# Patient Record
Sex: Male | Born: 1965 | Race: White | Hispanic: No | Smoking: Never smoker
Health system: Southern US, Community
[De-identification: ages and names within clinical notes are randomized; demographics above are authoritative.]

## PROBLEM LIST (undated history)

## (undated) DIAGNOSIS — I480 Paroxysmal atrial fibrillation: Secondary | ICD-10-CM

## (undated) DIAGNOSIS — N200 Calculus of kidney: Secondary | ICD-10-CM

## (undated) DIAGNOSIS — I10 Essential (primary) hypertension: Secondary | ICD-10-CM

## (undated) DIAGNOSIS — I7300 Raynaud's syndrome without gangrene: Secondary | ICD-10-CM

## (undated) HISTORY — DX: Raynaud's syndrome without gangrene: I73.00

## (undated) HISTORY — DX: Paroxysmal atrial fibrillation: I48.0

## (undated) HISTORY — DX: Essential (primary) hypertension: I10

---

## 2006-07-03 HISTORY — PX: OTHER SURGICAL HISTORY: SHX169

## 2011-09-17 ENCOUNTER — Encounter (HOSPITAL_COMMUNITY): Payer: Self-pay | Admitting: *Deleted

## 2011-09-17 ENCOUNTER — Emergency Department (HOSPITAL_COMMUNITY)
Admission: EM | Admit: 2011-09-17 | Discharge: 2011-09-17 | Disposition: A | Discharge status: Home / Self Care | Payer: BC Managed Care – PPO | Attending: Emergency Medicine | Admitting: Emergency Medicine

## 2011-09-17 DIAGNOSIS — I4891 Unspecified atrial fibrillation: Secondary | ICD-10-CM | POA: Insufficient documentation

## 2011-09-17 DIAGNOSIS — R109 Unspecified abdominal pain: Secondary | ICD-10-CM | POA: Insufficient documentation

## 2011-09-17 DIAGNOSIS — I2510 Atherosclerotic heart disease of native coronary artery without angina pectoris: Secondary | ICD-10-CM | POA: Insufficient documentation

## 2011-09-17 DIAGNOSIS — N23 Unspecified renal colic: Secondary | ICD-10-CM

## 2011-09-17 DIAGNOSIS — Z87442 Personal history of urinary calculi: Secondary | ICD-10-CM | POA: Insufficient documentation

## 2011-09-17 DIAGNOSIS — R110 Nausea: Secondary | ICD-10-CM | POA: Insufficient documentation

## 2011-09-17 HISTORY — DX: Calculus of kidney: N20.0

## 2011-09-17 LAB — URINALYSIS, ROUTINE W REFLEX MICROSCOPIC
Glucose, UA: NEGATIVE mg/dL
Leukocytes, UA: NEGATIVE
Nitrite: NEGATIVE
Protein, ur: NEGATIVE mg/dL

## 2011-09-17 LAB — URINE MICROSCOPIC-ADD ON

## 2011-09-17 MED ORDER — ONDANSETRON HCL 4 MG PO TABS: 4.0000 mg | ORAL_TABLET | Freq: Four times a day (QID) | ORAL | Status: AC

## 2011-09-17 MED ORDER — OXYCODONE-ACETAMINOPHEN 5-325 MG PO TABS
1.0000 | ORAL_TABLET | Freq: Once | ORAL | Status: AC
  Administered 2011-09-17: 1 via ORAL
  Filled 2011-09-17 (×2): qty 1

## 2011-09-17 MED ORDER — OXYCODONE-ACETAMINOPHEN 5-325 MG PO TABS: 1.0000 | ORAL_TABLET | ORAL | Status: AC | PRN

## 2011-09-17 MED ORDER — ONDANSETRON 4 MG PO TBDP
8.0000 mg | ORAL_TABLET | Freq: Once | ORAL | Status: AC
  Administered 2011-09-17: 8 mg via ORAL
  Filled 2011-09-17 (×2): qty 2

## 2011-09-17 NOTE — Discharge Instructions (Signed)
FOLLOW UP WITH [facility] FOR FURTHER EVALUATION AND MANAGEMENT IF YOU HAVE PERSISTENT PAIN. RETURN HERE WITH ANY WORSENING PAIN, HIGH FEVER OR UNCONTROLLED VOMITING. TAKE MEDICATIONS AS PRESCRIBED.  Ureteral Colic Ureteral colic is spasm-like pain from the kidney or the ureter. This is often caused by a kidney stone. The pain is caused by the stone trying to get through the tubes that pass your pee. HOME CARE   Drink enough fluids to keep your pee (urine) clear or pale yellow.   Strain all your pee. A strainer will be provided. Keep anything caught in the strainer and bring it to your doctor. The stone causing the pain may be very small.   Only take medicine as told by your doctor.   Follow up with your doctor as told.  GET HELP RIGHT AWAY IF:   Pain is not controlled with medicine.   Pain continues or gets worse.   The pain changes and there is chest or belly (abdominal) pain.   You pass out (faint).   You cannot pee.   You keep throwing up (vomiting).   You have a temperature by mouth above 102 F (38.9 C), not controlled by medicine.  MAKE SURE YOU:   Understand these instructions.   Will watch this condition.   Will get help right away if you are not doing well or get worse.  Document Released: 12/06/2007 Document Revised: 06/08/2011 Document Reviewed: 12/06/2007 [facility] Patient Information 2012 [place], [place].

## 2011-09-17 NOTE — ED Notes (Signed)
Discharge instructions reviewed with pt; verbalizes understanding.  No questions asked; no further c/o's voiced.  Pt ambulatory to lobby.  NAD noted. 

## 2011-09-17 NOTE — ED Provider Notes (Signed)
History     CSN: [id number]  Arrival date & time 09/17/11  2034   First MD Initiated Contact with Patient 09/17/11 2132      Chief Complaint  Patient presents with  . poss kidnes stone     (Consider location/radiation/quality/duration/timing/severity/associated sxs/prior treatment) Patient is a 46 y.o. male presenting with flank pain. The history is provided by the patient.  Flank Pain This is a new problem. The current episode started today. The problem occurs constantly. The problem has been gradually worsening. Associated symptoms include abdominal pain and nausea. Pertinent negatives include no chills, fever or vomiting. Associated symptoms comments: Left flank pain radiating into LLQ starting tonight. He has a remote history of kidney stones with similar symptoms. No difficulty urinating, no fever, no gross hematuria. . The symptoms are aggravated by nothing.  Flank Pain This is a new problem. The current episode started today. The problem occurs constantly. The problem has been gradually worsening. Associated symptoms include abdominal pain. Associated symptoms comments: Left flank pain radiating into LLQ starting tonight. He has a remote history of kidney stones with similar symptoms. No difficulty urinating, no fever, no gross hematuria. . The symptoms are aggravated by nothing.    Past Medical History  Diagnosis Date  . Kidney calculi   . Coronary artery disease   . Atrial fibrillation     History reviewed. No pertinent past surgical history.  History reviewed. No pertinent family history.  History  Substance Use Topics  . Smoking status: Never Smoker   . Smokeless tobacco: Not on file  . Alcohol Use: Yes      Review of Systems  Constitutional: Negative for fever and chills.  HENT: Negative.   Respiratory: Negative.   Cardiovascular: Negative.   Gastrointestinal: Positive for nausea and abdominal pain. Negative for vomiting.  Genitourinary: Positive for flank  pain. Negative for hematuria, scrotal swelling and testicular pain.  Musculoskeletal: Negative.   Skin: Negative.   Neurological: Negative.     Allergies  Review of patient's allergies indicates no known allergies.  Home Medications   Current Outpatient Rx  Name Route Sig Dispense Refill  . CITALOPRAM HYDROBROMIDE 20 MG PO TABS Oral Take 20 mg by mouth daily.      BP 146/94  Pulse 75  Temp(Src) 97.6 F (36.4 C) (Oral)  Resp 18  SpO2 98%  Physical Exam  Constitutional: He is oriented to person, place, and time. He appears well-developed and well-nourished.  Neck: Normal range of motion.  Pulmonary/Chest: Effort normal.  Abdominal: There is no tenderness. There is no rebound and no guarding.  Genitourinary:       No reproducible CVA tenderness.  Musculoskeletal: Normal range of motion.  Neurological: He is alert and oriented to person, place, and time.  Skin: Skin is warm and dry.  Psychiatric: He has a normal mood and affect.    ED Course  Procedures (including critical care time)  Labs Reviewed  URINALYSIS, ROUTINE W REFLEX MICROSCOPIC - Abnormal; Notable for the following:    APPearance CLOUDY (*)    Hgb urine dipstick LARGE (*)    All other components within normal limits  URINE MICROSCOPIC-ADD ON  URINALYSIS, ROUTINE W REFLEX MICROSCOPIC   No results found.   No diagnosis found.    MDM  Patient's VSS, no fever, hematuria and flank pain with nausea. He has history of stones that have passed without intervention. Feel referral to urology without CT evaluation is appropriate. He has be counseled on symptoms that  should prompt return.   Medical screening examination/treatment/procedure(s) were performed by non-physician practitioner and as supervising physician I was immediately available for consultation/collaboration. Osvaldo Human, M.D.      Rodena Medin, PA-C 09/17/11 2233  Carleene Cooper III, MD 09/19/11 [phone]

## 2011-09-17 NOTE — ED Notes (Signed)
The pt has had lt abd pain for one hour nauseated.  Hx of kidney stones

## 2011-09-17 NOTE — ED Notes (Signed)
Pt reports left sided non-radiating flank pain that started around 2000 this evening. Pt stated he has a history of kidney stones and it feels like the same pain.  Pt admits to nausea with one episode of emesis.

## 2011-12-14 DIAGNOSIS — R490 Dysphonia: Secondary | ICD-10-CM | POA: Insufficient documentation

## 2012-09-10 ENCOUNTER — Ambulatory Visit (INDEPENDENT_AMBULATORY_CARE_PROVIDER_SITE_OTHER): Payer: BC Managed Care – PPO | Admitting: Internal Medicine

## 2012-09-10 ENCOUNTER — Encounter: Payer: Self-pay | Admitting: Internal Medicine

## 2012-09-10 VITALS — BP 115/75 | HR 76 | Ht 72.0 in | Wt 195.6 lb

## 2012-09-10 DIAGNOSIS — I10 Essential (primary) hypertension: Secondary | ICD-10-CM

## 2012-09-10 DIAGNOSIS — I4891 Unspecified atrial fibrillation: Secondary | ICD-10-CM

## 2012-09-10 MED ORDER — DILTIAZEM HCL 120 MG PO TABS: 120.0000 mg | ORAL_TABLET | Freq: Every day | ORAL | Status: DC

## 2012-09-10 MED ORDER — DILTIAZEM HCL ER COATED BEADS 120 MG PO CP24: 120.0000 mg | ORAL_CAPSULE | Freq: Every day | ORAL | Status: DC

## 2012-09-10 NOTE — Assessment & Plan Note (Signed)
As above.

## 2012-09-10 NOTE — Assessment & Plan Note (Signed)
The patient has post ablation recurrences of atrial fibrillation that are infrequent. His CHADS-VASc score is sufficiently low that I don't think anticoagulation is necessary especially because of his aversions given his bicycling. We will also have to stop his aspirin.  There appears to be a vagal component to his atrial fibrillation notable for his postprandial onset. We will discontinue his beta blocker and put him on a calcium blocker. This may also improve   exercise performance.  He does not snore.  I also let him know that Dr. Clydie Braun and returned to the area and is working in [employer]

## 2012-09-10 NOTE — Patient Instructions (Addendum)
1. Your physician has requested that you have an echocardiogram. Echocardiography is a painless test that uses sound waves to create images of your heart. It provides your doctor with information about the size and shape of your heart and    how well your heart's chambers and valves are working. This procedure takes approximately one hour. There are no restrictions for this procedure.  2. Your physician has recommended you make the following change in your medication:   Stop Metoprolol Start Cardizem 120 mg by mouth once a day.  3. Your physician wants you to follow-up in: 1 year with Dr. Graciela Husbands. You will receive a reminder letter in the mail two months in advance. If you don't receive a letter, please call our office to schedule the follow-up appointment.

## 2012-09-10 NOTE — Addendum Note (Signed)
Addended by: Sherri Rad C on: 09/10/2012 06:01 PM   Modules accepted: Orders

## 2012-09-10 NOTE — Progress Notes (Signed)
   ELECTROPHYSIOLOGY CONSULT NOTE  Patient ID: Reginald Rich, MRN: [id number], DOB/AGE: 03/02/1966 47 y.o. Admit date: (Not on file) Date of Consult: 09/10/2012  Primary Physician: Default, Provider, MD Primary Cardiologist: new  Chief Complaint:  afib   HPI Reginald Rich is a 47 y.o. male seen to establish care for atrial fibrillation.  He has a long-standing history and had failed multiple medications and underwent catheter ablation by Dr. Clydie Braun in [facility] 2008. This is associated with a very beneficial affect characterized by significant reduction in AF episodes. These episodes typically are postprandial and resolved by the early morning. They occur every couple of months.  He has no known neurological symptoms. He does have treated hypertension. His CHADS-VASc score is 1 and his CHADS- score is 1.  He denies snoring. He is very fit.   Past Medical History  Diagnosis Date  . Kidney calculi   . Hypertension   . Atrial fibrillation       Surgical History: No past surgical history on file.   Home Meds: Prior to Admission medications   Medication Sig Start Date End Date Taking? Authorizing Provider  metoprolol (LOPRESSOR) 50 MG tablet Take 50 mg by mouth 2 (two) times daily.  08/26/12  Yes Historical Provider, MD    Allergies: No Known Allergies  History   Social History  . Marital Status: Married    Spouse Name: N/A    Number of Children: N/A  . Years of Education: N/A   Occupational History  . Not on file.   Social History Main Topics  . Smoking status: Never Smoker   . Smokeless tobacco: Not on file  . Alcohol Use: Yes  . Drug Use:   . Sexually Active:    Other Topics Concern  . Not on file   Social History Narrative  . No narrative on file     No family history on file.   ROS:  Please see the history of present illness.     All other systems reviewed and negative.    Physical Exam: Blood pressure 115/75, pulse 76, height 6' (1.829  m), weight 195 lb 9.6 oz (88.724 kg). General: Well developed, well nourished male in no acute distress. Head: Normocephalic, atraumatic, sclera non-icteric, no xanthomas, nares are without discharge. Lymph Nodes:  none Back: without scoliosis/kyphosis, no CVA tendersness Neck: Negative for carotid bruits. JVD not elevated. Lungs: Clear bilaterally to auscultation without wheezes, rales, or rhonchi. Breathing is unlabored. Heart: RRR with S1 S2. No murmur , rubs, or gallops appreciated. Abdomen: Soft, non-tender, non-distended with normoactive bowel sounds. No hepatomegaly. No rebound/guarding. No obvious abdominal masses. Msk:  Strength and tone appear normal for age. Extremities: No clubbing or cyanosis. No  edema.  Distal pedal pulses are 2+ and equal bilaterally. Skin: Warm and Dry Neuro: Alert and oriented X 3. CN III-XII intact Grossly normal sensory and motor function . Psych:  Responds to questions appropriately with a normal affect.      Labs:   Radiology/Studies:  No results found.  EKG:  Sinus rhythm at 75 Interval 17/09/38 Axis is -13   Assessment and Plan:    Sherryl Manges

## 2012-09-17 ENCOUNTER — Ambulatory Visit (HOSPITAL_COMMUNITY): Payer: BC Managed Care – PPO | Attending: Cardiology | Admitting: Radiology

## 2012-09-17 DIAGNOSIS — I10 Essential (primary) hypertension: Secondary | ICD-10-CM | POA: Insufficient documentation

## 2012-09-17 DIAGNOSIS — I379 Nonrheumatic pulmonary valve disorder, unspecified: Secondary | ICD-10-CM | POA: Insufficient documentation

## 2012-09-17 DIAGNOSIS — I4891 Unspecified atrial fibrillation: Secondary | ICD-10-CM

## 2012-09-17 NOTE — Progress Notes (Signed)
Echocardiogram performed.  

## 2012-10-02 ENCOUNTER — Telehealth: Payer: Self-pay | Admitting: Internal Medicine

## 2012-10-02 NOTE — Telephone Encounter (Signed)
New problem   Pt stated Reginald Rich called him about his lab results. Please call pt.

## 2012-10-02 NOTE — Telephone Encounter (Signed)
Pt notified or normal Echo results.

## 2012-12-13 ENCOUNTER — Telehealth: Payer: Self-pay | Admitting: Internal Medicine

## 2012-12-13 NOTE — Telephone Encounter (Signed)
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 He also states it is ok to schedule him a "late day" appt, it that OK? He asked for 3:45pm? Thanks!!

## 2012-12-13 NOTE — Telephone Encounter (Signed)
Yes, ok to use 2 acute slots for new pt.

## 2012-12-13 NOTE — Telephone Encounter (Signed)
Done kh

## 2012-12-26 ENCOUNTER — Ambulatory Visit (INDEPENDENT_AMBULATORY_CARE_PROVIDER_SITE_OTHER): Payer: BC Managed Care – PPO | Admitting: Internal Medicine

## 2012-12-26 ENCOUNTER — Encounter: Payer: Self-pay | Admitting: Internal Medicine

## 2012-12-26 VITALS — BP 140/84 | HR 80 | Temp 98.0°F | Ht 73.0 in | Wt 196.0 lb

## 2012-12-26 DIAGNOSIS — R6882 Decreased libido: Secondary | ICD-10-CM

## 2012-12-26 DIAGNOSIS — G4700 Insomnia, unspecified: Secondary | ICD-10-CM

## 2012-12-26 DIAGNOSIS — I4891 Unspecified atrial fibrillation: Secondary | ICD-10-CM

## 2012-12-26 DIAGNOSIS — R635 Abnormal weight gain: Secondary | ICD-10-CM

## 2012-12-26 MED ORDER — CLONAZEPAM 0.5 MG PO TABS: ORAL_TABLET | ORAL | Status: DC

## 2012-12-26 NOTE — Assessment & Plan Note (Signed)
Use low dose clonazepam as needed.

## 2012-12-26 NOTE — Progress Notes (Signed)
Subjective:    Patient ID: Reginald Rich, male    DOB: [birth year], 47 y.o.   MRN: [id number]  HPI  47 year old white male with history of known atrial fibrillation (status post ablation 2008) mild hypertension to establish. Patient complains of unexplained weight gain since September of 2013. At the same time patient also experienced low libido. He did stop exercising regularly. He did not feel any motivation. No significant change in dietary habits.  He was seen by his previous primary care physician who ordered free testosterone levels. Initially there were low normal but then returned to normal range. There was consideration of possibly using testosterone replacement.  Patient previously was taking beta blocker for intermittent A. Fib.  Patient reports he has not taken metoprolol regularly in over year. He takes metoprolol intermittently.  Unfortunately patient's intermittent A. Fib has returned. He is experiencing episodes every 2 weeks. A. Fib episodes can last between 10 minutes to  8-12 hours.  He was prescribed Cardizem by his cardiologist but it seemed to make palpitations worse.  Patient was on several antiarrhythmics exception of amiodarone before his ablation. He is not interested in trying other antiarrhythmics. He would like to consider repeating ablation for lone atrial fibrillation.  He also complains of intermittent sleep issues.  This has been ongoing for years. He has a habit of drinking liquids all through late afternoon and evening. He gets up once a night to urinate and goes back to sleep. Over last year he has been having difficulty returning to sleep. His previous primary care doctor tried him on zolpidem 10 mg. He could not tolerate  due to hangover effect.  He denies any signs or symptoms of objective sleep apnea. His wife has never noticed her snoring. He also denies any abnormal limb movements during sleep. Abnormal limb movements have been a problem whenever he takes  Ambien.  We reviewed his caffeine use.  He drinks green tea in the morning.  He drinks mainly on the weekend.   Review of Systems  Constitutional: Negative for appetite change.  Approximately 25 pound weight gain over last 12 months Eyes: Negative for visual disturbance.  Respiratory: Negative for cough, chest tightness and shortness of breath.   Cardiovascular: Negative for chest pain.  Genitourinary: Negative for difficulty urinating. Negative for urinary urgency or frequency Neurological: Negative for headaches.  Gastrointestinal: Negative for abdominal pain, heartburn melena or hematochezia Psych: Negative for depression or anxiety Endo:  No polyuria or polydypsia      Past Medical History  Diagnosis Date  . Kidney calculi   . Hypertension   . Atrial fibrillation     History   Social History  . Marital Status: Married    Spouse Name: N/A    Number of Children: N/A  . Years of Education: N/A   Occupational History  . Engineer    Social History Main Topics  . Smoking status: Never Smoker   . Smokeless tobacco: Not on file  . Alcohol Use: Yes  . Drug Use: No  . Sexually Active: Not on file   Other Topics Concern  . Not on file   Social History Narrative   Works as Art gallery manager for [employer]   Married - 2 sons    Past Surgical History  Procedure Laterality Date  . Ablation  2008    Family History  Problem Relation Age of Onset  . Atrial fibrillation Mother     No Known Allergies  No current outpatient prescriptions on file  prior to visit.   No current facility-administered medications on file prior to visit.    BP 140/84  Pulse 80  Temp(Src) 98 F (36.7 C) (Oral)  Ht 6\' 1"  (1.854 m)  Wt 196 lb (88.905 kg)  BMI 25.86 kg/m2    Lab results reviewed. See attached sheets from [facility]    Objective:   Physical Exam  Constitutional: He is oriented to person, place, and time. He appears well-developed and well-nourished. No  distress.  HENT:  Head: Normocephalic and atraumatic.  Right Ear: External ear normal.  Left Ear: External ear normal.  Mouth/Throat: Oropharynx is clear and moist. No oropharyngeal exudate.  Eyes: EOM are normal. Pupils are equal, round, and reactive to light. No scleral icterus.  Neck: Normal range of motion. Neck supple. No thyromegaly present.  No carotid bruit  Cardiovascular: Normal rate, regular rhythm, normal heart sounds and intact distal pulses.   No murmur heard. Pulmonary/Chest: Effort normal and breath sounds normal. He has no wheezes.  Abdominal: Soft. Bowel sounds are normal. He exhibits no mass. There is no tenderness.  Musculoskeletal: He exhibits no edema.  Lymphadenopathy:    He has no cervical adenopathy.  Neurological: He is alert and oriented to person, place, and time. No cranial nerve deficit.  Skin: Skin is warm and dry.  Psychiatric: He has a normal mood and affect. His behavior is normal.          Assessment & Plan:

## 2012-12-26 NOTE — Assessment & Plan Note (Signed)
Patient experienced decrease in libido over last 12 months. This is been associated with weight gain. Free testosterone levels are normal. I doubt his symptoms secondary to hypogonadism. I suspect his sleep issues may be responsible for libido changes and weight gain.  We discussed sleep hygiene changes.  Due to unexplained weight gain, I suggest we repeat thyroid function tests.  Patient advised to continue his regular exercise program and also monitor his caloric intake.

## 2012-12-26 NOTE — Assessment & Plan Note (Addendum)
Patient experiencing recurrence of intermittent atrial fibrillation. He is not interested in retrying antiarrhythmics. He would like referral to [facility] electrophysiologist who performs cardiac ablations.  Defer to cardiology re: changing B Blocker.  I suspect metoprolol contributing to low libido.

## 2012-12-26 NOTE — Patient Instructions (Addendum)
Cut out all caffeine in your diet Stop liquid intake after 6 pm or with your evening meal

## 2012-12-27 LAB — TSH: TSH: 1.24 u[IU]/mL (ref 0.35–5.50)

## 2013-01-16 ENCOUNTER — Ambulatory Visit: Payer: BC Managed Care – PPO | Admitting: Internal Medicine

## 2013-04-04 ENCOUNTER — Encounter: Payer: Self-pay | Admitting: *Deleted

## 2013-04-07 ENCOUNTER — Encounter: Payer: Self-pay | Admitting: Internal Medicine

## 2013-04-07 ENCOUNTER — Telehealth: Payer: Self-pay | Admitting: Internal Medicine

## 2013-04-07 ENCOUNTER — Ambulatory Visit (INDEPENDENT_AMBULATORY_CARE_PROVIDER_SITE_OTHER): Payer: BC Managed Care – PPO | Admitting: Internal Medicine

## 2013-04-07 VITALS — BP 147/90 | HR 86 | Ht 73.0 in | Wt 191.0 lb

## 2013-04-07 DIAGNOSIS — I4891 Unspecified atrial fibrillation: Secondary | ICD-10-CM

## 2013-04-07 DIAGNOSIS — I7300 Raynaud's syndrome without gangrene: Secondary | ICD-10-CM | POA: Insufficient documentation

## 2013-04-07 MED ORDER — DILTIAZEM HCL ER COATED BEADS 120 MG PO CP24: 120.0000 mg | ORAL_CAPSULE | Freq: Every day | ORAL | Status: DC

## 2013-04-07 MED ORDER — FLECAINIDE ACETATE 100 MG PO TABS: 100.0000 mg | ORAL_TABLET | Freq: Two times a day (BID) | ORAL | Status: DC

## 2013-04-07 NOTE — Progress Notes (Signed)
Patient Care Team: Doe-Hyun Sherran Needs, DO as PCP - General (Internal Medicine)   HPI  Toron Canning is a 47 y.o. male Seen in followup for atrial fibrillation. He is status post catheter ablation by Dr. Sampson Goon.  The last visit we tried him on Rythmol which he found to be proarrhythmic with more palpitations. He noted further that was associated with feeling cold and blue fingers and toes He is also noted that his blood pressures been up.  His CHADS-VASc score is 1 and he is not on anticoagulation  Past Medical History  Diagnosis Date  . Kidney calculi   . Hypertension   . Atrial fibrillation     Past Surgical History  Procedure Laterality Date  . Ablation  2008    Current Outpatient Prescriptions  Medication Sig Dispense Refill  . clonazePAM (KLONOPIN) 0.5 MG tablet Take 1/2 to 1 tablet at bedtime as needed  30 tablet  1   No current facility-administered medications for this visit.    No Known Allergies  Review of Systems negative except from HPI and PMH  Physical Exam BP 147/90  Pulse 86  Ht 6\' 1"  (1.854 m)  Wt 191 lb (86.637 kg)  BMI 25.2 kg/m2 W Also bradycardic ell developed and nourished in no acute distress HENT normal Neck supple with JVP-flat Clear Regular rate and rhythm, no murmurs or gallops Abd-soft with active BS No Clubbing cyanosis edema Skin-warm and dry A & Oriented  Grossly normal sensory and motor function  ecg NSR  17/09/36  Assessment and  Plan

## 2013-04-07 NOTE — Telephone Encounter (Signed)
New Problem  Pt states he was prescribed Cardizem and he is already taking the medication and is having trouble with it// please call back to discus.Marland Kitchen

## 2013-04-07 NOTE — Patient Instructions (Addendum)
Your physician has recommended you make the following change in your medication:  1) Start Flecanide 100 twice daily 2) Start Cardizem 120 mg once daily  Your physician has requested that you have an exercise tolerance test. For further information please visit [email or web address] Please also follow instruction sheet, as given.  Your physician recommends that you schedule a follow-up appointment in: 3 months with Dr. Graciela Husbands.

## 2013-04-08 ENCOUNTER — Other Ambulatory Visit: Payer: Self-pay | Admitting: *Deleted

## 2013-04-08 DIAGNOSIS — I4891 Unspecified atrial fibrillation: Secondary | ICD-10-CM

## 2013-04-08 NOTE — Telephone Encounter (Signed)
Patient states that he was taking Cardizem before (not Rythmol), and this medication aggravated his AFib. He wants to make sure Dr. Graciela Husbands still wants him to take this medication. I will discuss with Dr. Graciela Husbands and get back with him on advisement. Patient agreeable to plan.

## 2013-04-08 NOTE — Telephone Encounter (Signed)
Advised patient to continue Cardizem, and we want to see how the Cardizem and Flecanide together affects him. If symptoms persist our next step is to try Rythmol and explained this to patient. He will keep [place] informed if issues arise.  He inquired about GXT scheduling and I told him I would get back with him. Patient agreeable to plan.

## 2013-04-08 NOTE — Telephone Encounter (Signed)
Explained to patient that our clinical supervisor, Doylene Bode, was looking into the scheduling and we will be in touch with him by the end of the week to schedule this procedure for him. Patient is agreeable to plan

## 2013-04-09 NOTE — Telephone Encounter (Signed)
New Problem  Pt states that he was recommended for an ablation and request a call back to discuss further.

## 2013-04-09 NOTE — Telephone Encounter (Signed)
Spoke with patient who states he has discussed medication treatment options with Dr. Graciela Husbands and patient would rather have an ablation.  I advised patient that the nurses that handle the scheduling for ablations are not in the office today and that I will forward message for someone to call him back tomorrow.  Patient states he had an ablation 5 years ago and thinks he needs a "touch up."  I advised that I would route message to EP team and that someone would call him back tomorrow.  Patient verbalized understanding and agreement.

## 2013-04-10 NOTE — Telephone Encounter (Signed)
Returned patient's call - he voiced his request to have an ablation instead of trying medication again. He states that he has been down that road before and ended up with ablation anyway, he would like to go forward with procedure and not with medication. I explained that I would discuss with Dr. Graciela Husbands and get back with him later today.

## 2013-04-10 NOTE — Assessment & Plan Note (Signed)
Pr with recurrent AFib and not quite sure what his meds are but thinks propafenone made it worse   we discussed drug alternatives, including flecainide or repeat catheter ablation   His previous porcedure was done by Clydie Braun  He initially decided to pursue drug therapy, but has called back, and asked to see Dr Fawn Kirk for consideration of ablation  We will arrange  He will need to take anticoagulation in anticipation of that procedure so will begin him but defer initiation until he sees Dr Fawn Kirk as he is bike rider and would like to minimize the time of increased risk

## 2013-04-10 NOTE — Telephone Encounter (Signed)
Scheduled patient to see Dr. Johney Frame for consult for ablation - 05/01/13 at 3:45pm. Patient verbalizes understanding and agreeable to plan. His GXT has been cancelled.

## 2013-04-21 ENCOUNTER — Other Ambulatory Visit: Payer: Self-pay | Admitting: Internal Medicine

## 2013-05-01 ENCOUNTER — Encounter: Payer: Self-pay | Admitting: Internal Medicine

## 2013-05-01 ENCOUNTER — Ambulatory Visit (INDEPENDENT_AMBULATORY_CARE_PROVIDER_SITE_OTHER): Payer: BC Managed Care – PPO | Admitting: Internal Medicine

## 2013-05-01 VITALS — BP 148/88 | HR 69 | Ht 73.0 in | Wt 191.1 lb

## 2013-05-01 DIAGNOSIS — I10 Essential (primary) hypertension: Secondary | ICD-10-CM

## 2013-05-01 DIAGNOSIS — I4891 Unspecified atrial fibrillation: Secondary | ICD-10-CM

## 2013-05-01 MED ORDER — LOSARTAN POTASSIUM 25 MG PO TABS: 25.0000 mg | ORAL_TABLET | Freq: Every day | ORAL | Status: DC

## 2013-05-01 NOTE — Progress Notes (Signed)
Primary Care Physician: Thomos Lemons, DO Referring Physician:  Dr Charlaine Dalton is a 47 y.o. male with a h/o paroxysmal atrial fibrillation and hypertension s/p prior afib ablation by Dr Sampson Goon in 2008 who presents for further evaluation of afib.  He reports initially being diagnosed with atrial fibrillation in 2002.  He failed medical therapy diltiazem, flecainide, rhythmol, sotalol, and digoxin.  He then underwent afib ablation in 2008.  He reports doing very well without recurrent afib for 3-4 years.  Over the past year, he has had recurrence of his afib.  Episodes have increased in both frequency and duration.  He has tried Rythmol and flecainide without improvement.  He felt that he actually had more afib when he tried diltiazem.  He reports episodes of afib nearly daily lasting through the evening and terminating in the morning.  He is unaware of any triggers or precipitants.  Interestingly, he has started lexapro 3 weeks ago.  He has had no afib over the past 2 weeks. During afib, he reports symptoms of palpitations with associated decreased exercise tolerance, and postural dizziness.  Today, he denies symptoms of chest pain, shortness of breath at rest, orthopnea, PND, lower extremity edema, presyncope, syncope, snoring/ apnea, or neurologic sequela. The patient is otherwise without complaint today.   Past Medical History  Diagnosis Date  . Kidney calculi   . Hypertension   . Paroxysmal atrial fibrillation   . Raynaud's phenomenon associated with beta blocker use    Past Surgical History  Procedure Laterality Date  . Afib ablation  2008    [facility] by Dr Sampson Goon    Current Outpatient Prescriptions  Medication Sig Dispense Refill  . clonazePAM (KLONOPIN) 0.5 MG tablet TAKE 1/2 TO 1 TABLET BY MOUTH EVERY NIGHT AT BEDTIME AS NEEDED  30 tablet  3  . escitalopram (LEXAPRO) 10 MG tablet Take 1 tablet by mouth daily.       No current facility-administered medications  for this visit.    No Known Allergies  History   Social History  . Marital Status: Married    Spouse Name: N/A    Number of Children: N/A  . Years of Education: N/A   Occupational History  . Engineer    Social History Main Topics  . Smoking status: Never Smoker   . Smokeless tobacco: Not on file  . Alcohol Use: Yes  . Drug Use: No  . Sexual Activity: Not on file   Other Topics Concern  . Not on file   Social History Narrative   Works as Art gallery manager for [employer]   Married - 2 sons    Family History  Problem Relation Age of Onset  . Atrial fibrillation Mother 6    ROS- All systems are reviewed and negative except as per the HPI above  Physical Exam: Filed Vitals:   05/01/13 1634  BP: 148/88  Pulse: 69  Height: 6\' 1"  (1.854 m)  Weight: 191 lb 1.9 oz (86.691 kg)    GEN- The patient is well appearing, alert and oriented x 3 today.   Head- normocephalic, atraumatic Eyes-  Sclera clear, conjunctiva pink Ears- hearing intact Oropharynx- clear Neck- supple, no JVP Lymph- no cervical lymphadenopathy Lungs- Clear to ausculation bilaterally, normal work of breathing Heart- Regular rate and rhythm, no murmurs, rubs or gallops, PMI not laterally displaced GI- soft, NT, ND, + BS Extremities- no clubbing, cyanosis, or edema MS- no significant deformity or atrophy Skin- no rash or lesion Psych- euthymic  mood, full affect Neuro- strength and sensation are intact  EKG today reveals sinus rhythm 69 bpm, PR 166, incomplete RBBB, Qtc 428 Echo 09/17/12- EF 55-60%, LA size 38mm, no significant valvular disease Dr Koren Bound notes are reviewed  Assessment and Plan:  1. Paroxysmal atrial fibrillation The patient has symptomatic recurrent paroxysmal atrial fibrillation.  He is very in tuned with his afib and its various treatment options.  We had a nice discussion today about afib management.  Therapeutic strategies for afib including medicine and ablation were discussed in detail  with the patient today. Risk, benefits, and alternatives to EP study and radiofrequency ablation for afib were also discussed in detail today.   Interestingly, he has found at least initial improvement in his afib with Lexapro.  He would therefore like to continue a watchful waiting strategy at this point.  If he has resumption in his afib frequency then he will contact my office and we will proceed with ablation.  If his afib remains quiescent with lexapro, then he will return in 2-3 months for further discussion.  2. HTN BP is above goal today Initiate losartan 25mg  daily and follow as ARBs are implicated in possible reduction of atrial fibrosis and positive remodeling in patients with htn and afib.  We will obtain a bmet today upon drug initiation as well as upon return in follow-up

## 2013-05-01 NOTE — Patient Instructions (Addendum)
Your physician recommends that you schedule a follow-up appointment in: 3 months with Dr Johney Frame   Call Anselm Pancoast if you want to proceed with Ablation  Your physician has recommended you make the following change in your medication:  1) Start Losartan 25mg  daily  Your physician recommends that you return for lab work today:BMP

## 2013-05-02 LAB — BASIC METABOLIC PANEL
BUN: 18 mg/dL (ref 6–23)
CO2: 30 mEq/L (ref 19–32)
Chloride: 102 mEq/L (ref 96–112)
Creatinine, Ser: 0.9 mg/dL (ref 0.4–1.5)
Glucose, Bld: 82 mg/dL (ref 70–99)
Potassium: 3.9 mEq/L (ref 3.5–5.1)

## 2013-05-08 ENCOUNTER — Other Ambulatory Visit: Payer: Self-pay

## 2013-06-09 ENCOUNTER — Encounter: Payer: Self-pay | Admitting: Internal Medicine

## 2013-06-10 ENCOUNTER — Encounter: Payer: Self-pay | Admitting: Internal Medicine

## 2013-06-10 MED ORDER — ESCITALOPRAM OXALATE 10 MG PO TABS: 10.0000 mg | ORAL_TABLET | Freq: Every day | ORAL | Status: DC

## 2013-07-07 ENCOUNTER — Encounter: Payer: Self-pay | Admitting: Internal Medicine

## 2013-07-07 ENCOUNTER — Ambulatory Visit (INDEPENDENT_AMBULATORY_CARE_PROVIDER_SITE_OTHER): Payer: BC Managed Care – PPO | Admitting: Internal Medicine

## 2013-07-07 VITALS — BP 132/98 | HR 80 | Temp 98.2°F | Ht 73.0 in | Wt 203.0 lb

## 2013-07-07 DIAGNOSIS — G4700 Insomnia, unspecified: Secondary | ICD-10-CM

## 2013-07-07 DIAGNOSIS — I4891 Unspecified atrial fibrillation: Secondary | ICD-10-CM

## 2013-07-07 DIAGNOSIS — I10 Essential (primary) hypertension: Secondary | ICD-10-CM

## 2013-07-07 DIAGNOSIS — F411 Generalized anxiety disorder: Secondary | ICD-10-CM

## 2013-07-07 MED ORDER — DILTIAZEM HCL ER COATED BEADS 120 MG PO CP24: 120.0000 mg | ORAL_CAPSULE | Freq: Every day | ORAL | Status: DC

## 2013-07-07 MED ORDER — CLONAZEPAM 0.5 MG PO TABS: 0.5000 mg | ORAL_TABLET | Freq: Every evening | ORAL | Status: DC | PRN

## 2013-07-07 MED ORDER — DILTIAZEM HCL ER 240 MG PO CP24: 240.0000 mg | ORAL_CAPSULE | Freq: Every day | ORAL | Status: DC

## 2013-07-07 MED ORDER — LOSARTAN POTASSIUM 50 MG PO TABS: 50.0000 mg | ORAL_TABLET | Freq: Every day | ORAL | Status: DC

## 2013-07-07 MED ORDER — ESCITALOPRAM OXALATE 10 MG PO TABS: 10.0000 mg | ORAL_TABLET | Freq: Every day | ORAL | Status: DC

## 2013-07-07 NOTE — Assessment & Plan Note (Signed)
Increase Diltiazem dose.  Hold off on Losartan for now.   BP: 132/98 mmHg

## 2013-07-07 NOTE — Progress Notes (Signed)
   Subjective:    Patient ID: Reginald Rich, male    DOB: March 27, 1966, 48 y.o.   MRN: [id number]  HPI  48 year old white male with history of paroxysmal atrial fibrillation and hypertension for followup. Patient seen by Dr. Johney FrameAllred for possible ablation. He reported that after starting Lexapro, he seemed to have less episodes of atrial fibrillation. Patient also reports sleep quality has significantly improved since starting clonazepam. It does not cause hangover effect like zolpidem.  He has been using Lexapro 10 mg for the last 2-3 months. He has gained more than 12 pounds. He denies any adverse sexual side effects.  Review of Systems Negative for chest pain.  No regular exercise    Past Medical History  Diagnosis Date  . Kidney calculi   . Hypertension   . Paroxysmal atrial fibrillation   . Raynaud's phenomenon associated with beta blocker use     History   Social History  . Marital Status: Married    Spouse Name: N/A    Number of Children: N/A  . Years of Education: N/A   Occupational History  . Engineer    Social History Main Topics  . Smoking status: Never Smoker   . Smokeless tobacco: Not on file  . Alcohol Use: Yes  . Drug Use: No  . Sexual Activity: Not on file   Other Topics Concern  . 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 Afib ablation  2008    [facility] by Dr Sampson GoonFitzgerald    Family History  Problem Relation Age of Onset  . Atrial fibrillation Mother 2765    No Known Allergies  No current outpatient prescriptions on file prior to visit.   No current facility-administered medications on file prior to visit.    BP 132/98  Pulse 80  Temp(Src) 98.2 F (36.8 C) (Oral)  Ht 6\' 1"  (1.854 m)  Wt 203 lb (92.08 kg)  BMI 26.79 kg/m2    Objective:   Physical Exam  Constitutional: He is oriented to person, place, and time. He appears  well-developed and well-nourished. No distress.  HENT:  Head: Normocephalic and atraumatic.  Cardiovascular: Normal rate.   No murmur heard. irregularly irregular  Pulmonary/Chest: Effort normal and breath sounds normal. He has no wheezes.  Neurological: He is alert and oriented to person, place, and time. No cranial nerve deficit.  Skin: Skin is warm and dry.  Psychiatric: He has a normal mood and affect. His behavior is normal.          Assessment & Plan:

## 2013-07-07 NOTE — Assessment & Plan Note (Signed)
Patient reports good response to Lexapro. In hindsight, patient describes multiple family members have issues with anxiety.  Continue same dose of Lexapro 10 mg.  Patient advised to consider taking his Lexapro at bedtime. Continue same dose of clonazepam 0.5 mg at bedtime as needed for sleep.

## 2013-07-07 NOTE — Patient Instructions (Signed)
Please complete the following lab tests before your next follow up appointment: BMET - 401.9 

## 2013-07-07 NOTE — Assessment & Plan Note (Signed)
Patient having less anxiety about atrial fibrillation however unclear whether he is actually experiencing less frequent episodes. I suggest he followup with Dr. Johney FrameAllred for either Holter monitor or event recorder.  EKG today showed sinus tachycardia at 108 bpm with first degree AV block.  Increase Diltiazem CD to 240 mg.

## 2013-07-23 ENCOUNTER — Ambulatory Visit: Payer: BC Managed Care – PPO | Admitting: Internal Medicine

## 2013-08-22 ENCOUNTER — Ambulatory Visit: Payer: BC Managed Care – PPO | Admitting: Internal Medicine

## 2013-10-17 ENCOUNTER — Ambulatory Visit: Payer: BC Managed Care – PPO | Admitting: Internal Medicine

## 2014-02-03 ENCOUNTER — Other Ambulatory Visit: Payer: Self-pay | Admitting: Internal Medicine

## 2014-12-03 ENCOUNTER — Other Ambulatory Visit: Payer: Self-pay | Admitting: Internal Medicine

## 2014-12-07 ENCOUNTER — Ambulatory Visit (INDEPENDENT_AMBULATORY_CARE_PROVIDER_SITE_OTHER): Payer: BLUE CROSS/BLUE SHIELD | Admitting: Internal Medicine

## 2014-12-07 ENCOUNTER — Other Ambulatory Visit: Payer: Self-pay | Admitting: *Deleted

## 2014-12-07 ENCOUNTER — Encounter: Payer: Self-pay | Admitting: Internal Medicine

## 2014-12-07 VITALS — BP 148/86 | HR 95 | Ht 73.0 in | Wt 206.0 lb

## 2014-12-07 DIAGNOSIS — I10 Essential (primary) hypertension: Secondary | ICD-10-CM

## 2014-12-07 DIAGNOSIS — I480 Paroxysmal atrial fibrillation: Secondary | ICD-10-CM

## 2014-12-07 MED ORDER — DILTIAZEM HCL ER COATED BEADS 120 MG PO CP24
120.0000 mg | ORAL_CAPSULE | Freq: Every day | ORAL | Status: DC
Start: 2014-12-07 — End: 2015-01-08

## 2014-12-07 MED ORDER — FLECAINIDE ACETATE 100 MG PO TABS
ORAL_TABLET | ORAL | Status: DC
Start: 2014-12-07 — End: 2015-01-08

## 2014-12-07 NOTE — Patient Instructions (Signed)
Medication Instructions:  Your physician has recommended you make the following change in your medication:  1) Start Diltiazem 120mg  daily 2) Take Flecainide 100 mg ---take 3 pills at once at the onset of afib  Labwork: None ordered  Testing/Procedures: None ordered  Follow-Up: Your physician recommends that you schedule a follow-up appointment in: 4 weeks with Rudi Cocoonna Carroll, NP   Any Other Special Instructions Will Be Listed Below (If Applicable).

## 2014-12-07 NOTE — Progress Notes (Signed)
Electrophysiology Office Note   Date:  12/07/2014   ID:  Reginald Rich, DOB 10/24/1965, MRN [id number]  PCP:  Thomos Lemons, DO   Primary Electrophysiologist: Hillis Range, MD    Chief Complaint  Patient presents with  . Atrial Fibrillation     History of Present Illness: Reginald Rich is a 49 y.o. male who presents today for electrophysiology evaluation.   I have not seen him since 2014.  His AF seems stable.  He reports palpitations a couple times per month.  He reports that they may last from several minutes to all day.  He feels that these are worse when his BP is elevated.  When I saw him last, I prescribed losartan.  He says that he did not tolerate this because it made his "heart crazy".  Today, he denies symptoms of  chest pain, shortness of breath, orthopnea, PND, lower extremity edema, claudication, dizziness, presyncope, syncope, bleeding, or neurologic sequela. The patient is tolerating medications without difficulties and is otherwise without complaint today.    Past Medical History  Diagnosis Date  . Kidney calculi   . Hypertension   . Paroxysmal atrial fibrillation   . Raynaud's phenomenon associated with beta blocker use    Past Surgical History  Procedure Laterality Date  . Afib ablation  2008    [facility] by Dr Sampson Goon     Current Outpatient Prescriptions  Medication Sig Dispense Refill  . escitalopram (LEXAPRO) 10 MG tablet TAKE 1 TABLET BY MOUTH EVERY DAY 90 tablet 1   No current facility-administered medications for this visit.    Allergies:   Review of patient's allergies indicates no known allergies.   Social History:  The patient  reports that he has never smoked. He does not have any smokeless tobacco history on file. He reports that he drinks alcohol. He reports that he does not use illicit drugs.   Family History:  The patient's  family history includes Atrial fibrillation in his maternal grandfather; Atrial fibrillation (age of  onset: 38) in his mother; Hypertension in his sister.    ROS:  Please see the history of present illness.   All other systems are reviewed and negative.    PHYSICAL EXAM: VS:  BP 148/86 mmHg  Pulse 95  Ht  (1.854 m)  Wt 93.441 kg (206 lb)  BMI 27.18 kg/m2 , BMI Body mass index is 27.18 kg/(m^2). GEN: Well nourished, well developed, in no acute distress HEENT: normal Neck: no JVD, carotid bruits, or masses Cardiac: RRR; no murmurs, rubs, or gallops,no edema  Respiratory:  clear to auscultation bilaterally, normal work of breathing GI: soft, nontender, nondistended, + BS MS: no deformity or atrophy Skin: warm and dry  Neuro:  Strength and sensation are intact Psych: euthymic mood, full affect  EKG:  EKG is ordered today. The ekg ordered today shows sinus rhythm 95 bpm, otherwise normal ekg   Recent Labs: No results found for requested labs within last 365 days.    Lipid Panel  No results found for: CHOL, TRIG, HDL, CHOLHDL, VLDL, LDLCALC, LDLDIRECT   Wt Readings from Last 3 Encounters:  12/07/14 93.441 kg (206 lb)  07/07/13 92.08 kg (203 lb)  05/01/13 86.691 kg (191 lb 1.9 oz)      Other studies Reviewed: Additional studies/ records that were reviewed today include: echo  Review of the above records today demonstrates: preserved EF, normal LA size   ASSESSMENT AND PLAN:  1.  Paroxysmal atrial fibrillation/ palpitations Will add  diltiazem CD 120mg  da ily.  This can be increased upon follow-up in the AF clinic if needed chads2vasc score is 1.  Not on anticoagulation Can use flecainide as pill in pocket If afib increases, could consider flecainide 50mg  BID  2. HTN Above goal Add diltiazem  Today, I have spent 25  minutes with the patient discussing afib management .  More than 50% of the visit time today was spent on this issue.    Follow-up with Rudi Cocoonna Carroll NP in the AF clinic in 4 weeks.  I think that he can be managed in the AF clinic and I can see  when needed   Current medicines are reviewed at length with the patient today.   The patient does not have concerns regarding his medicines.  The following changes were made today:  none   Signed, Hillis RangeJames Aeron Lheureux, MD  12/07/2014 2:07 PM     [facility] [address] [place] [address] [phone] (office) [phone] (fax)

## 2014-12-07 NOTE — Telephone Encounter (Signed)
Ok to RF x 2 

## 2014-12-07 NOTE — Telephone Encounter (Signed)
Patient is requesting a refill - Clonazepam 0.5 mg, take 1 tablet by mouth every night at bedtime as needed for anxiety.  [address] [place], [phone] Okay to fill?

## 2014-12-09 MED ORDER — CLONAZEPAM 0.5 MG PO TABS: 0.5000 mg | ORAL_TABLET | Freq: Every day | ORAL | Status: DC

## 2014-12-09 NOTE — Addendum Note (Signed)
Addended by: Alfred LevinsWYRICK, CINDY D on: 12/09/2014 02:20 PM   Modules accepted: Orders

## 2014-12-09 NOTE — Telephone Encounter (Signed)
rx sent in electronically 

## 2015-01-08 ENCOUNTER — Other Ambulatory Visit: Payer: Self-pay

## 2015-01-08 ENCOUNTER — Ambulatory Visit (HOSPITAL_COMMUNITY)
Admission: RE | Admit: 2015-01-08 | Discharge: 2015-01-08 | Disposition: A | Discharge status: Home / Self Care | Payer: BLUE CROSS/BLUE SHIELD | Source: Ambulatory Visit | Attending: Nurse Practitioner | Admitting: Nurse Practitioner

## 2015-01-08 ENCOUNTER — Encounter (HOSPITAL_COMMUNITY): Payer: Self-pay | Admitting: Nurse Practitioner

## 2015-01-08 VITALS — BP 140/106 | HR 117 | Ht 73.0 in | Wt 198.2 lb

## 2015-01-08 DIAGNOSIS — I4892 Unspecified atrial flutter: Secondary | ICD-10-CM | POA: Diagnosis not present

## 2015-01-08 DIAGNOSIS — Z79899 Other long term (current) drug therapy: Secondary | ICD-10-CM | POA: Insufficient documentation

## 2015-01-08 DIAGNOSIS — I480 Paroxysmal atrial fibrillation: Secondary | ICD-10-CM | POA: Diagnosis present

## 2015-01-08 DIAGNOSIS — I10 Essential (primary) hypertension: Secondary | ICD-10-CM | POA: Diagnosis not present

## 2015-01-08 MED ORDER — DILTIAZEM HCL ER COATED BEADS 120 MG PO CP24: 240.0000 mg | ORAL_CAPSULE | Freq: Every day | ORAL | Status: DC

## 2015-01-08 MED ORDER — FLECAINIDE ACETATE 50 MG PO TABS: 50.0000 mg | ORAL_TABLET | Freq: Two times a day (BID) | ORAL | Status: DC

## 2015-01-08 NOTE — Patient Instructions (Signed)
Your physician has recommended you make the following change in your medication:  1)Flecainide  twice a day  Parking code [id number]

## 2015-01-08 NOTE — Progress Notes (Signed)
Patient ID: Reginald Rich, male   DOB: [birth year], 49 y.o.   MRN: [id number]    Date:  01/08/2015   ID:  Reginald Rich, DOB [birth year], MRN [id number]  PCP:  Thomos Lemonsobert Yoo, DO   Primary Electrophysiologist: Hillis RangeAllred, James, MD  Chief Complaint  Patient presents with  . Atrial Flutter     History of Present Illness: Reginald Rich is a 49 y.o. male who presents today for f/u in afib clinic, seen by Dr. Johney FrameAllred 7/8. He had not seen him since 2014. Prior Af ablation by Dr. Sampson [place]Fitzgerald in [place].   He reported palpitations a couple times per month.  He reported that they may last from several minutes to all day.  He felt that these were worse when his BP  is elevated.  When he saw Dr. Johney FrameAllred last, he prescribed losartan.  He said that he did not tolerate this because it made his "heart crazy".  He was started on cardizem  qd with pill in the pocket flecainide. He doubled the dose of cardizem to 240 mg qd due to 120 mg not doing the job. He is in aflutter today at 117 bpm. States he has a lot of afib/flutter burden since being seen by Dr. Johney FrameAllred. He will try flecainide 50 mg bid to control rhythm. States a pharmacist advised him not to take flecainide 100 mg x3 due to potential of QT prolongation,  lexapro and trazadone. I checked with two pharmacists with [facility] who said although it is a potential to prolong the QT, flecainide is not known to substantially  prolong QT. QT when the pt was in SR at 419 ms.  He knows being on daily flecainide that he cant take the pill in a pocket dose of  flecainide.  Today, he denies symptoms of  chest pain, shortness of breath, orthopnea, PND, lower extremity edema, claudication, dizziness, presyncope, syncope, bleeding, or neurologic sequela. The patient is tolerating medications without difficulties and is otherwise without complaint today.    Past Medical History  Diagnosis Date  . Kidney calculi   . Hypertension   . Paroxysmal atrial fibrillation   .  Raynaud's phenomenon associated with beta blocker use    Past Surgical History  Procedure Laterality Date  . Afib ablation  2008    [facility] by Dr Sampson GoonFitzgerald     Current Outpatient Prescriptions  Medication Sig Dispense Refill  . clonazePAM (KLONOPIN) 0.5 MG tablet TAKE 1 TABLET BY MOUTH EVERY NIGHT AT BEDTIME AS NEEDED FOR ANXIETY 30 tablet 2  . diltiazem (CARDIZEM CD) 120 MG 24 hr capsule Take 2 capsules (240 mg total) by mouth daily. 90 capsule 3  . escitalopram (LEXAPRO) 10 MG tablet TAKE 1 TABLET BY MOUTH EVERY DAY 90 tablet 1  . traZODone (DESYREL) 50 MG tablet Take 50 mg by mouth at bedtime.    . flecainide (TAMBOCOR) 50 MG tablet Take 1 tablet (50 mg total) by mouth 2 (two) times daily. 60 tablet 3   No current facility-administered medications for this encounter.    Allergies:   Review of patient's allergies indicates no known allergies.   Social History:  The patient  reports that he has never smoked. He does not have any smokeless tobacco history on file. He reports that he drinks alcohol. He reports that he does not use illicit drugs.   Family History:  The patient's  family history includes Atrial fibrillation in his maternal grandfather; Atrial fibrillation (age of onset: 7165) in his mother; Hypertension  in his sister.    ROS:  Please see the history of present illness.   All other systems are reviewed and negative.    PHYSICAL EXAM: VS:  BP 140/106 mmHg  Pulse 117  Ht  (1.854 m)  Wt 198 lb 3.2 oz (89.903 kg)  BMI 26.16 kg/m2 , BMI Body mass index is 26.16 kg/(m^2). GEN: Well nourished, well developed, in no acute distress HEENT: normal Neck: no JVD, carotid bruits, or masses Cardiac: RRR; no murmurs, rubs, or gallops,no edema  Respiratory:  clear to auscultation bilaterally, normal work of breathing GI: soft, nontender, nondistended, + BS MS: no deformity or atrophy Skin: warm and dry  Neuro:  Strength and sensation are intact Psych: euthymic mood,  full affect  EKG:  EKG is ordered today. The ekg ordered today shows atrial flutter with 2:1 conduction. QRS int 90 ms, QTc 443 ms. QTc on last EKG while in NSR was 419 ms.   Recent Labs: No results found for requested labs within last 365 days.    Lipid Panel  No results found for: CHOL, TRIG, HDL, CHOLHDL, VLDL, LDLCALC, LDLDIRECT   Wt Readings from Last 3 Encounters:  01/08/15 198 lb 3.2 oz (89.903 kg)  12/07/14 206 lb (93.441 kg)  07/07/13 203 lb (92.08 kg)      Other studies Reviewed: Additional studies/ records that were reviewed today include: echo  Review of the above records today demonstrates: preserved EF, normal LA size   ASSESSMENT AND PLAN:  1.  Paroxysmal atrial fibrillation/ palpitations with increased burden Continue diltiazem CD  daily. Per Dr. Jenel Lucks plan add flecainide 50 mg bid Pt aware while on daily flecainide can not take the 300 mg pill in pocket dose.   2. HTN Above goal Pt encouraged to f/u with pcp to manage Still elevated despite 240 mg cardizem qd States couldn't tolerate losartan in the past.   F/u on Tuesday for repeat EKG on flecainide   Current medicines are reviewed at length with the patient today.      Don Perking, NP  01/08/2015 1:18 PM

## 2015-01-12 ENCOUNTER — Ambulatory Visit (HOSPITAL_COMMUNITY)
Admission: RE | Admit: 2015-01-12 | Discharge: 2015-01-12 | Disposition: A | Discharge status: Home / Self Care | Payer: BLUE CROSS/BLUE SHIELD | Source: Ambulatory Visit | Attending: Nurse Practitioner | Admitting: Nurse Practitioner

## 2015-01-12 ENCOUNTER — Encounter (HOSPITAL_COMMUNITY): Payer: Self-pay | Admitting: Nurse Practitioner

## 2015-01-12 ENCOUNTER — Other Ambulatory Visit: Payer: Self-pay

## 2015-01-12 VITALS — BP 142/96 | HR 71 | Wt 198.0 lb

## 2015-01-12 DIAGNOSIS — I480 Paroxysmal atrial fibrillation: Secondary | ICD-10-CM | POA: Diagnosis present

## 2015-01-12 NOTE — Patient Instructions (Signed)
Medication Instructions:  Your physician recommends that you continue on your current medications as directed. Please refer to the Current Medication list given to you today.   Labwork: None ordered  Testing/Procedures: Your physician has requested that you have an exercise tolerance test. For further information please visit [email or web address]. Please also follow instruction sheet, as given.    Follow-Up: Your physician recommends that you schedule a follow-up appointment 4 weeks with Rudi Cocoonna Carroll, NP   Any Other Special Instructions Will Be Listed Below (If Applicable).

## 2015-01-12 NOTE — Progress Notes (Signed)
Returns today after being on flecainide 50 mg bid for several days. EKG shows SR with normal intervals. Will schedule for GXT next week. I will see him back in one month. Still expresses some concern with mildly elevated bp, I have encouraged to have PCP manage.

## 2015-01-18 ENCOUNTER — Other Ambulatory Visit (HOSPITAL_COMMUNITY): Payer: Self-pay | Admitting: *Deleted

## 2015-01-18 DIAGNOSIS — I480 Paroxysmal atrial fibrillation: Secondary | ICD-10-CM

## 2015-01-20 ENCOUNTER — Encounter (HOSPITAL_COMMUNITY): Payer: BLUE CROSS/BLUE SHIELD

## 2015-01-20 ENCOUNTER — Ambulatory Visit (HOSPITAL_COMMUNITY): Payer: BLUE CROSS/BLUE SHIELD | Admitting: Nurse Practitioner

## 2015-01-29 ENCOUNTER — Ambulatory Visit (HOSPITAL_COMMUNITY)
Admission: RE | Admit: 2015-01-29 | Discharge: 2015-01-29 | Disposition: A | Discharge status: Home / Self Care | Payer: BLUE CROSS/BLUE SHIELD | Source: Ambulatory Visit | Attending: Nurse Practitioner | Admitting: Nurse Practitioner

## 2015-01-29 ENCOUNTER — Encounter (HOSPITAL_COMMUNITY): Payer: Self-pay | Admitting: Nurse Practitioner

## 2015-01-29 ENCOUNTER — Other Ambulatory Visit (HOSPITAL_COMMUNITY): Payer: Self-pay | Admitting: *Deleted

## 2015-01-29 DIAGNOSIS — I480 Paroxysmal atrial fibrillation: Secondary | ICD-10-CM

## 2015-01-29 MED ORDER — DILTIAZEM HCL ER COATED BEADS 120 MG PO CP24: 240.0000 mg | ORAL_CAPSULE | Freq: Two times a day (BID) | ORAL | Status: DC

## 2015-01-29 NOTE — Progress Notes (Signed)
Patient ID: Reginald Rich, male   DOB: Jan 21, 1966, 49 y.o.   MRN: [id number]  pt came today for GXT due to flecainide use. However, test was not done due to pt saying that he is having a lot of breakthrough afib and flecainide was d/ced. He was noted to have some short runs of afib on monitor. Continue cardizem and I will see f/u 8/9 for other options.  Elvina Sidle [facility] [address] [place], [place] [address] [phone]

## 2015-02-09 ENCOUNTER — Inpatient Hospital Stay (HOSPITAL_COMMUNITY)
Admission: RE | Admit: 2015-02-09 | Payer: BLUE CROSS/BLUE SHIELD | Source: Ambulatory Visit | Admitting: Nurse Practitioner

## 2015-04-13 ENCOUNTER — Other Ambulatory Visit: Payer: Self-pay

## 2015-04-19 ENCOUNTER — Ambulatory Visit (INDEPENDENT_AMBULATORY_CARE_PROVIDER_SITE_OTHER): Payer: BLUE CROSS/BLUE SHIELD | Admitting: Internal Medicine

## 2015-04-19 ENCOUNTER — Encounter: Payer: Self-pay | Admitting: Internal Medicine

## 2015-04-19 VITALS — BP 138/86 | HR 149 | Ht 73.0 in | Wt 192.8 lb

## 2015-04-19 DIAGNOSIS — I480 Paroxysmal atrial fibrillation: Secondary | ICD-10-CM | POA: Diagnosis not present

## 2015-04-19 DIAGNOSIS — I10 Essential (primary) hypertension: Secondary | ICD-10-CM

## 2015-04-19 MED ORDER — DILTIAZEM HCL 30 MG PO TABS: ORAL_TABLET | ORAL | Status: DC

## 2015-04-19 MED ORDER — FLECAINIDE ACETATE 100 MG PO TABS: 100.0000 mg | ORAL_TABLET | Freq: Two times a day (BID) | ORAL | Status: DC

## 2015-04-19 MED ORDER — RIVAROXABAN 20 MG PO TABS
20.0000 mg | ORAL_TABLET | Freq: Every day | ORAL | Status: DC
Start: 2015-04-19 — End: 2015-08-31

## 2015-04-19 NOTE — Patient Instructions (Signed)
Medication Instructions:  Your physician has recommended you make the following change in your medication:  1) Start Flecainide 100 mg twice daily--now 2) Start Cardizem 30 mg ---take 1 tablet by mouth as needed every 6 hours for fast heart rates 3) Start Xarelto 20 mg daily on 05/17/15   Labwork: Your physician recommends that you return for lab work on 06/03/15 at 7:30am---you do not have to fast   Testing/Procedures: Your physician has requested that you have cardiac CT. Cardiac computed tomography (CT) is a painless test that uses an x-ray machine to take clear, detailed pictures of your heart. For further information please visit [email or web address]. Please follow instruction sheet as given.---will need 1-2 weeks prior to his ablation on 06/10/15  Your physician has recommended that you have an ablation. Catheter ablation is a medical procedure used to treat some cardiac arrhythmias (irregular heartbeats). During catheter ablation, a long, thin, flexible tube is put into a blood vessel in your groin (upper thigh), or neck. This tube is called an ablation catheter. It is then guided to your heart through the blood vessel. Radio frequency waves destroy small areas of heart tissue where abnormal heartbeats may cause an arrhythmia to start. Please see the instruction sheet given to you today.    Follow-Up: Your physician recommends that you schedule a follow-up appointment in: 4 weeks from 06/10/15 with Rudi Cocoonna Carroll, NP and 3 months from 06/10/15 with Dr Johney FrameAllred   Any Other Special Instructions Will Be Listed Below (If Applicable).

## 2015-04-19 NOTE — Progress Notes (Signed)
Electrophysiology Office Note   Date:  04/19/2015   ID:  Reginald Rich, DOB [birth year], MRN [id number]  PCP:  Reginald Lemonsobert Yoo, DO   Primary Electrophysiologist: Reginald RangeJames Karl Knarr, MD    Chief Complaint  Patient presents with  . PAF     History of Present Illness: Reginald Rich is a 49 y.o. male who presents today for electrophysiology evaluation. He continues to have symptomatic atrial fibrillation with symptoms of tachypalpitations.  He has failed medical therapy with flecainide.  Today, he denies symptoms of  chest pain, shortness of breath, orthopnea, PND, lower extremity edema, claudication, dizziness, presyncope, syncope, bleeding, or neurologic sequela. The patient is tolerating medications without difficulties and is otherwise without complaint today.    Past Medical History  Diagnosis Date  . Kidney calculi   . Hypertension   . Paroxysmal atrial fibrillation (HCC)   . Raynaud's phenomenon associated with beta blocker use    Past Surgical History  Procedure Laterality Date  . Afib ablation  2008    [facility] by Dr Sampson GoonFitzgerald     Current Outpatient Prescriptions  Medication Sig Dispense Refill  . clonazePAM (KLONOPIN) 0.5 MG tablet TAKE 1 TABLET BY MOUTH EVERY NIGHT AT BEDTIME AS NEEDED FOR ANXIETY 30 tablet 2  . escitalopram (LEXAPRO) 10 MG tablet TAKE 1 TABLET BY MOUTH EVERY DAY 90 tablet 1  . traZODone (DESYREL) 50 MG tablet Take 50 mg by mouth at bedtime.    Marland Kitchen. diltiazem (CARDIZEM) 30 MG tablet take 1 tablet by mouth as needed every 6 hours for fast heart rates 30 tablet 3  . flecainide (TAMBOCOR) 100 MG tablet Take 1 tablet (100 mg total) by mouth 2 (two) times daily. 180 tablet 3  . rivaroxaban (XARELTO) 20 MG TABS tablet Take 1 tablet (20 mg total) by mouth daily with supper. 30 tablet 11   No current facility-administered medications for this visit.    Allergies:   Review of patient's allergies indicates no known allergies.   Social History:  The  patient  reports that he has never smoked. He does not have any smokeless tobacco history on file. He reports that he drinks alcohol. He reports that he does not use illicit drugs.   Family History:  The patient's  family history includes Atrial fibrillation in his maternal grandfather; Atrial fibrillation (age of onset: 4765) in his mother; Hypertension in his sister.    ROS:  Please see the history of present illness.   All other systems are reviewed and negative.    PHYSICAL EXAM: VS:  BP 138/86 mmHg  Pulse 149  Ht 6\' 1"  (1.854 m)  Wt 192 lb 12.8 oz (87.454 kg)  BMI 25.44 kg/m2 , BMI Body mass index is 25.44 kg/(m^2). GEN: Well nourished, well developed, in no acute distress HEENT: normal Neck: no JVD, carotid bruits, or masses Cardiac: iRRR; no murmurs, rubs, or gallops,no edema  Respiratory:  clear to auscultation bilaterally, normal work of breathing GI: soft, nontender, nondistended, + BS MS: no deformity or atrophy Skin: warm and dry  Neuro:  Strength and sensation are intact Psych: euthymic mood, full affect  EKG:  EKG is ordered today. The ekg ordered today shows sinus rhythm with a salvo of atach vs afib (likely pulmonary vein in etiology)   Recent Labs: No results found for requested labs within last 365 days.    Lipid Panel  No results found for: CHOL, TRIG, HDL, CHOLHDL, VLDL, LDLCALC, LDLDIRECT   Wt Readings from Last 3 Encounters:  04/19/15 192 lb 12.8 oz (87.454 kg)  01/12/15 198 lb (89.812 kg)  01/08/15 198 lb 3.2 oz (89.903 kg)      Other studies Reviewed: Additional studies/ records that were reviewed today include: echo  Review of the above records today demonstrates: preserved EF, normal LA size   ASSESSMENT AND PLAN:  1.  Paroxysmal atrial fibrillation  Failed medical therapy with diltiazem and felcainide S/p prior afib ablation by Dr Sampson Goon with CTI ablation at the same time. Therapeutic strategies for afib including medicine and  ablation were discussed in detail with the patient today. Risk, benefits, and alternatives to EP study and radiofrequency ablation for afib were also discussed in detail today. These risks include but are not limited to stroke, bleeding, vascular damage, tamponade, perforation, damage to the esophagus, lungs, and other structures, pulmonary vein stenosis, worsening renal function, and death. The patient understands these risk and wishes to proceed.  We will therefore proceed with catheter ablation at the next available time.  chads2vasc score is 1.  Start xarelto in anticipation of ablation. Given prior ablation, will obtain cardiac CT prior to ablation to evaluate PV anatomy and to exclude prior PV stenosis.  If LAA is without thrombus and is well seen, should not require TEE. I am suspicious of PV triggers based on ekg today.  Will try to obtain prior ablation records from [facility].  2. HTN Stable No change required today  Current medicines are reviewed at length with the patient today.   The patient does not have concerns regarding his medicines.  The following changes were made today:  none   Signed, Reginald Range, MD  04/19/2015 9:34 PM     [facility] [address] [place] [address] [phone] (office) [phone] (fax)

## 2015-04-20 ENCOUNTER — Encounter: Payer: Self-pay | Admitting: *Deleted

## 2015-05-24 ENCOUNTER — Other Ambulatory Visit: Payer: Self-pay | Admitting: Cardiology

## 2015-05-24 ENCOUNTER — Telehealth: Payer: Self-pay | Admitting: Internal Medicine

## 2015-05-24 ENCOUNTER — Encounter: Payer: Self-pay | Admitting: Internal Medicine

## 2015-05-24 NOTE — Telephone Encounter (Signed)
Spoke with Reginald Rich and she is going to call him to schedule this as it can only be done 1-2 weeks prior to his ablation

## 2015-05-24 NOTE — Telephone Encounter (Signed)
New message     Pt is due to have an ablation in December.  He thought he is to have a CT prior to appt but it has not been set up.  Please call

## 2015-05-24 NOTE — Telephone Encounter (Signed)
Lmom with patient that Jasmine DecemberSharon will be calling him to schedule

## 2015-05-24 NOTE — Telephone Encounter (Signed)
He is scheduled for 06/04/15

## 2015-06-03 ENCOUNTER — Other Ambulatory Visit (INDEPENDENT_AMBULATORY_CARE_PROVIDER_SITE_OTHER): Payer: 59 | Admitting: *Deleted

## 2015-06-03 ENCOUNTER — Other Ambulatory Visit: Payer: BLUE CROSS/BLUE SHIELD

## 2015-06-03 DIAGNOSIS — F411 Generalized anxiety disorder: Secondary | ICD-10-CM

## 2015-06-03 DIAGNOSIS — I4891 Unspecified atrial fibrillation: Secondary | ICD-10-CM | POA: Diagnosis not present

## 2015-06-03 DIAGNOSIS — G4700 Insomnia, unspecified: Secondary | ICD-10-CM

## 2015-06-03 DIAGNOSIS — I10 Essential (primary) hypertension: Secondary | ICD-10-CM

## 2015-06-03 LAB — CBC WITH DIFFERENTIAL/PLATELET
Basophils Absolute: 0.1 10*3/uL (ref 0.0–0.1)
Basophils Relative: 1 % (ref 0–1)
Eosinophils Absolute: 0.1 10*3/uL (ref 0.0–0.7)
Eosinophils Relative: 1 % (ref 0–5)
HCT: 45.8 % (ref 39.0–52.0)
HEMOGLOBIN: 16.5 g/dL (ref 13.0–17.0)
LYMPHS PCT: 17 % (ref 12–46)
Lymphs Abs: 1.2 10*3/uL (ref 0.7–4.0)
MCH: 30.8 pg (ref 26.0–34.0)
MCHC: 36 g/dL (ref 30.0–36.0)
MCV: 85.6 fL (ref 78.0–100.0)
MONOS PCT: 9 % (ref 3–12)
MPV: 9.9 fL (ref 8.6–12.4)
Monocytes Absolute: 0.6 10*3/uL (ref 0.1–1.0)
NEUTROS ABS: 5 10*3/uL (ref 1.7–7.7)
NEUTROS PCT: 72 % (ref 43–77)
Platelets: 195 10*3/uL (ref 150–400)
RBC: 5.35 MIL/uL (ref 4.22–5.81)
RDW: 13.3 % (ref 11.5–15.5)
WBC: 7 10*3/uL (ref 4.0–10.5)

## 2015-06-03 LAB — BASIC METABOLIC PANEL
BUN: 14 mg/dL (ref 7–25)
CALCIUM: 9.5 mg/dL (ref 8.6–10.3)
CO2: 26 mmol/L (ref 20–31)
Chloride: 103 mmol/L (ref 98–110)
Creat: 0.97 mg/dL (ref 0.60–1.35)
Glucose, Bld: 99 mg/dL (ref 65–99)
POTASSIUM: 4.2 mmol/L (ref 3.5–5.3)
SODIUM: 138 mmol/L (ref 135–146)

## 2015-06-04 ENCOUNTER — Encounter (HOSPITAL_COMMUNITY): Payer: Self-pay

## 2015-06-04 ENCOUNTER — Ambulatory Visit (HOSPITAL_COMMUNITY)
Admission: RE | Admit: 2015-06-04 | Discharge: 2015-06-04 | Disposition: A | Discharge status: Home / Self Care | Payer: 59 | Source: Ambulatory Visit | Attending: Internal Medicine | Admitting: Internal Medicine

## 2015-06-04 DIAGNOSIS — I480 Paroxysmal atrial fibrillation: Secondary | ICD-10-CM | POA: Diagnosis not present

## 2015-06-04 MED ORDER — NITROGLYCERIN 0.4 MG SL SUBL
SUBLINGUAL_TABLET | SUBLINGUAL | Status: AC
  Filled 2015-06-04: qty 2

## 2015-06-04 MED ORDER — METOPROLOL TARTRATE 1 MG/ML IV SOLN
5.0000 mg | Freq: Once | INTRAVENOUS | Status: AC
  Administered 2015-06-04: 5 mg via INTRAVENOUS
  Filled 2015-06-04: qty 5

## 2015-06-04 MED ORDER — METOPROLOL TARTRATE 1 MG/ML IV SOLN
INTRAVENOUS | Status: AC
  Administered 2015-06-04: 5 mg via INTRAVENOUS
  Filled 2015-06-04: qty 10

## 2015-06-04 MED ORDER — IOHEXOL 350 MG/ML SOLN
100.0000 mL | Freq: Once | INTRAVENOUS | Status: AC | PRN
  Administered 2015-06-04: 100 mL via INTRAVENOUS

## 2015-06-04 MED ORDER — NITROGLYCERIN 0.4 MG SL SUBL: 0.4000 mg | SUBLINGUAL_TABLET | Freq: Once | SUBLINGUAL | Status: DC

## 2015-06-10 ENCOUNTER — Encounter (HOSPITAL_COMMUNITY)
Admission: RE | Disposition: A | Discharge status: Home / Self Care | Payer: Self-pay | Source: Ambulatory Visit | Attending: Internal Medicine

## 2015-06-10 ENCOUNTER — Ambulatory Visit (HOSPITAL_COMMUNITY): Payer: 59 | Admitting: Anesthesiology

## 2015-06-10 ENCOUNTER — Ambulatory Visit (HOSPITAL_COMMUNITY)
Admission: RE | Admit: 2015-06-10 | Discharge: 2015-06-11 | Disposition: A | Discharge status: Home / Self Care | Payer: 59 | Source: Ambulatory Visit | Attending: Internal Medicine | Admitting: Internal Medicine

## 2015-06-10 DIAGNOSIS — I10 Essential (primary) hypertension: Secondary | ICD-10-CM | POA: Insufficient documentation

## 2015-06-10 DIAGNOSIS — I4891 Unspecified atrial fibrillation: Secondary | ICD-10-CM | POA: Diagnosis present

## 2015-06-10 DIAGNOSIS — I480 Paroxysmal atrial fibrillation: Secondary | ICD-10-CM | POA: Diagnosis not present

## 2015-06-10 HISTORY — PX: ELECTROPHYSIOLOGIC STUDY: SHX172A

## 2015-06-10 LAB — POCT ACTIVATED CLOTTING TIME
ACTIVATED CLOTTING TIME: 302 s
Activated Clotting Time: 286 seconds
Activated Clotting Time: 188 seconds

## 2015-06-10 LAB — MRSA PCR SCREENING: MRSA by PCR: NEGATIVE

## 2015-06-10 SURGERY — ATRIAL FIBRILLATION ABLATION
Anesthesia: General

## 2015-06-10 MED ORDER — ONDANSETRON HCL 4 MG/2ML IJ SOLN: 4.0000 mg | Freq: Four times a day (QID) | INTRAMUSCULAR | Status: DC | PRN

## 2015-06-10 MED ORDER — HEPARIN SODIUM (PORCINE) 1000 UNIT/ML IJ SOLN
INTRAMUSCULAR | Status: AC
  Filled 2015-06-10: qty 1

## 2015-06-10 MED ORDER — DOBUTAMINE IN D5W 4-5 MG/ML-% IV SOLN
INTRAVENOUS | Status: DC | PRN
  Administered 2015-06-10: 20 ug/kg/min via INTRAVENOUS

## 2015-06-10 MED ORDER — MIDAZOLAM HCL 5 MG/5ML IJ SOLN
INTRAMUSCULAR | Status: DC | PRN
  Administered 2015-06-10: 2 mg via INTRAVENOUS

## 2015-06-10 MED ORDER — LIDOCAINE HCL (CARDIAC) 20 MG/ML IV SOLN
INTRAVENOUS | Status: DC | PRN
  Administered 2015-06-10: 100 mg via INTRAVENOUS

## 2015-06-10 MED ORDER — ADENOSINE 6 MG/2ML IV SOLN
INTRAVENOUS | Status: DC | PRN
  Administered 2015-06-10 (×4): 12 mg via INTRAVENOUS

## 2015-06-10 MED ORDER — ONDANSETRON HCL 4 MG/2ML IJ SOLN
INTRAMUSCULAR | Status: DC | PRN
  Administered 2015-06-10: 4 mg via INTRAVENOUS

## 2015-06-10 MED ORDER — PROPOFOL 10 MG/ML IV BOLUS
INTRAVENOUS | Status: DC | PRN
  Administered 2015-06-10: 200 mg via INTRAVENOUS

## 2015-06-10 MED ORDER — HEPARIN SODIUM (PORCINE) 1000 UNIT/ML IJ SOLN
INTRAMUSCULAR | Status: DC | PRN
  Administered 2015-06-10: 3000 [IU] via INTRAVENOUS
  Administered 2015-06-10: 1000 [IU] via INTRAVENOUS

## 2015-06-10 MED ORDER — DOBUTAMINE IN D5W 4-5 MG/ML-% IV SOLN
INTRAVENOUS | Status: AC
  Filled 2015-06-10: qty 250

## 2015-06-10 MED ORDER — DEXTROSE 5 % IV SOLN
10.0000 mg | INTRAVENOUS | Status: DC | PRN
  Administered 2015-06-10: 10 ug/min via INTRAVENOUS

## 2015-06-10 MED ORDER — IOHEXOL 350 MG/ML SOLN
INTRAVENOUS | Status: DC | PRN
  Administered 2015-06-10: 2 mL via INTRACARDIAC

## 2015-06-10 MED ORDER — ACETAMINOPHEN 325 MG PO TABS
650.0000 mg | ORAL_TABLET | ORAL | Status: DC | PRN
  Administered 2015-06-11: 650 mg via ORAL
  Filled 2015-06-10: qty 2

## 2015-06-10 MED ORDER — BUPIVACAINE HCL (PF) 0.25 % IJ SOLN
INTRAMUSCULAR | Status: DC | PRN
  Administered 2015-06-10: 30 mL

## 2015-06-10 MED ORDER — SODIUM CHLORIDE 0.9 % IV SOLN: 250.0000 mL | INTRAVENOUS | Status: DC | PRN

## 2015-06-10 MED ORDER — FENTANYL CITRATE (PF) 100 MCG/2ML IJ SOLN
INTRAMUSCULAR | Status: DC | PRN
  Administered 2015-06-10: 100 ug via INTRAVENOUS

## 2015-06-10 MED ORDER — ESCITALOPRAM OXALATE 10 MG PO TABS: 10.0000 mg | ORAL_TABLET | Freq: Every day | ORAL | Status: DC

## 2015-06-10 MED ORDER — HEPARIN SODIUM (PORCINE) 1000 UNIT/ML IJ SOLN
INTRAMUSCULAR | Status: DC | PRN
  Administered 2015-06-10: 1000 [IU] via INTRAVENOUS
  Administered 2015-06-10: 12000 [IU] via INTRAVENOUS

## 2015-06-10 MED ORDER — ADENOSINE 6 MG/2ML IV SOLN
INTRAVENOUS | Status: AC
  Filled 2015-06-10: qty 16

## 2015-06-10 MED ORDER — LACTATED RINGERS IV SOLN
INTRAVENOUS | Status: DC | PRN
  Administered 2015-06-10 (×2): via INTRAVENOUS

## 2015-06-10 MED ORDER — SODIUM CHLORIDE 0.9 % IJ SOLN: 3.0000 mL | INTRAMUSCULAR | Status: DC | PRN

## 2015-06-10 MED ORDER — HYDROCODONE-ACETAMINOPHEN 5-325 MG PO TABS: 1.0000 | ORAL_TABLET | ORAL | Status: DC | PRN

## 2015-06-10 MED ORDER — CLONAZEPAM 0.5 MG PO TABS: 0.5000 mg | ORAL_TABLET | Freq: Every day | ORAL | Status: DC

## 2015-06-10 MED ORDER — TRAZODONE HCL 50 MG PO TABS
50.0000 mg | ORAL_TABLET | Freq: Every day | ORAL | Status: DC
Start: 2015-06-10 — End: 2015-06-11
  Administered 2015-06-10: 50 mg via ORAL
  Filled 2015-06-10: qty 1

## 2015-06-10 MED ORDER — RIVAROXABAN 20 MG PO TABS
20.0000 mg | ORAL_TABLET | Freq: Every day | ORAL | Status: DC
  Administered 2015-06-10: 20 mg via ORAL
  Filled 2015-06-10: qty 1

## 2015-06-10 MED ORDER — BUPIVACAINE HCL (PF) 0.25 % IJ SOLN
INTRAMUSCULAR | Status: AC
  Filled 2015-06-10: qty 30

## 2015-06-10 MED ORDER — SODIUM CHLORIDE 0.9 % IJ SOLN
3.0000 mL | Freq: Two times a day (BID) | INTRAMUSCULAR | Status: DC
  Administered 2015-06-10: 3 mL via INTRAVENOUS

## 2015-06-10 MED ORDER — PROTAMINE SULFATE 10 MG/ML IV SOLN
INTRAVENOUS | Status: DC | PRN
  Administered 2015-06-10: 20 mg via INTRAVENOUS
  Administered 2015-06-10: 10 mg via INTRAVENOUS

## 2015-06-10 SURGICAL SUPPLY — 18 items
BAG SNAP BAND KOVER 36X36 (MISCELLANEOUS) ×3 IMPLANT
BLANKET WARM UNDERBOD FULL ACC (MISCELLANEOUS) ×3 IMPLANT
CATH NAVISTAR SMARTTOUCH DF (ABLATOR) ×3 IMPLANT
CATH SOUNDSTAR ECO REPROCESSED (CATHETERS) ×3 IMPLANT
CATH VARIABLE LASSO NAV 2515 (CATHETERS) ×3 IMPLANT
CATH WEBSTER BI DIR CS D-F CRV (CATHETERS) ×3 IMPLANT
COVER SWIFTLINK CONNECTOR (BAG) ×3 IMPLANT
NEEDLE TRANSEP BRK 71CM 407200 (NEEDLE) ×3 IMPLANT
PACK EP LATEX FREE (CUSTOM PROCEDURE TRAY) ×2
PACK EP LF (CUSTOM PROCEDURE TRAY) ×1 IMPLANT
PAD DEFIB LIFELINK (PAD) ×3 IMPLANT
PATCH CARTO3 (PAD) ×3 IMPLANT
SHEATH AVANTI 11F 11CM (SHEATH) ×3 IMPLANT
SHEATH PINNACLE 7F 10CM (SHEATH) ×6 IMPLANT
SHEATH PINNACLE 9F 10CM (SHEATH) ×3 IMPLANT
SHEATH SWARTZ TS SL2 63CM 8.5F (SHEATH) ×3 IMPLANT
SHIELD RADPAD SCOOP 12X17 (MISCELLANEOUS) ×3 IMPLANT
TUBING SMART ABLATE COOLFLOW (TUBING) ×3 IMPLANT

## 2015-06-10 NOTE — Progress Notes (Addendum)
Site area: Right groin a 7,9,11 french venous sheath was removed  Site Prior to Removal:  Level 0  Pressure Applied For 30 MINUTES    Minutes Beginning at 1115a  Manual:   Yes.    Patient Status During Pull:  stable  Post Pull Groin Site:  Level 0  Post Pull Instructions Given:  Yes.    Post Pull Pulses Present:  Yes.    Dressing Applied:  Yes.    Comments:  VS remain stable during sheath pull.  Sheath pulled by Jonny RuizJohn

## 2015-06-10 NOTE — Transfer of Care (Signed)
Immediate Anesthesia Transfer of Care Note  Patient: Reginald Rich  Procedure(s) Performed: Procedure(s): Atrial Fibrillation Ablation (N/A)  Patient Location: Cath Lab  Anesthesia Type:General  Level of Consciousness: awake, alert  and oriented  Airway & Oxygen Therapy: Patient Spontanous Breathing and Patient connected to nasal cannula oxygen  Post-op Assessment: Report given to RN, Post -op Vital signs reviewed and stable and Patient moving all extremities X 4  Post vital signs: Reviewed and stable  Last Vitals:  Filed Vitals:   06/10/15 0539  BP: 137/98  Pulse: 82  Temp: 36.6 C  Resp: 18    Complications: No apparent anesthesia complications

## 2015-06-10 NOTE — Discharge Instructions (Signed)

## 2015-06-10 NOTE — Anesthesia Preprocedure Evaluation (Addendum)
Anesthesia Evaluation  Patient identified by MRN, date of birth, ID band Patient awake    Reviewed: Allergy & Precautions, NPO status , Patient's Chart, lab work & pertinent test results, reviewed documented beta blocker date and time   Airway Mallampati: II  TM Distance: >3 FB Neck ROM: Full    Dental   Pulmonary    Pulmonary exam normal        Cardiovascular hypertension, Normal cardiovascular exam+ dysrhythmias Atrial Fibrillation      Neuro/Psych    GI/Hepatic   Endo/Other    Renal/GU      Musculoskeletal   Abdominal   Peds  Hematology   Anesthesia Other Findings   Reproductive/Obstetrics                           Anesthesia Physical Anesthesia Plan  ASA: II  Anesthesia Plan: General   Post-op Pain Management:    Induction: Intravenous  Airway Management Planned: LMA  Additional Equipment:   Intra-op Plan:   Post-operative Plan:   Informed Consent: I have reviewed the patients History and Physical, chart, labs and discussed the procedure including the risks, benefits and alternatives for the proposed anesthesia with the patient or authorized representative who has indicated his/her understanding and acceptance.   Dental advisory given  Plan Discussed with: CRNA and Surgeon  Anesthesia Plan Comments:        Anesthesia Quick Evaluation

## 2015-06-10 NOTE — H&P (Signed)
Chief Complaint  Patient presents with  . PAF    History of Present Illness: Reginald Rich is a 49 y.o. male who presents today for afib ablation. He continues to have symptomatic atrial fibrillation with symptoms of tachypalpitations. He has failed medical therapy with flecainide.  He had prior ablation by Dr Peggye Form.  Today, he denies symptoms of chest pain, shortness of breath, orthopnea, PND, lower extremity edema, claudication, dizziness, presyncope, syncope, bleeding, or neurologic sequela. The patient is tolerating medications without difficulties and is otherwise without complaint today.    Past Medical History  Diagnosis Date  . Kidney calculi   . Hypertension   . Paroxysmal atrial fibrillation (HCC)   . Raynaud's phenomenon associated with beta blocker use    Past Surgical History  Procedure Laterality Date  . Afib ablation  2008    [facility] by Dr Sampson Goon     Current Outpatient Prescriptions  Medication Sig Dispense Refill  . clonazePAM (KLONOPIN) 0.5 MG tablet TAKE 1 TABLET BY MOUTH EVERY NIGHT AT BEDTIME AS NEEDED FOR ANXIETY 30 tablet 2  . escitalopram (LEXAPRO) 10 MG tablet TAKE 1 TABLET BY MOUTH EVERY DAY 90 tablet 1  . traZODone (DESYREL) 50 MG tablet Take 50 mg by mouth at bedtime.    Marland Kitchen diltiazem (CARDIZEM) 30 MG tablet take 1 tablet by mouth as needed every 6 hours for fast heart rates 30 tablet 3  . flecainide (TAMBOCOR) 100 MG tablet Take 1 tablet (100 mg total) by mouth 2 (two) times daily. 180 tablet 3  . rivaroxaban (XARELTO) 20 MG TABS tablet Take 1 tablet (20 mg total) by mouth daily with supper. 30 tablet 11   No current facility-administered medications for this visit.    Allergies: Review of patient's allergies indicates no known allergies.   Social History: The patient  reports that he has never smoked. He does not have any smokeless tobacco history on file.  He reports that he drinks alcohol. He reports that he does not use illicit drugs.   Family History: The patient's family history includes Atrial fibrillation in his maternal grandfather; Atrial fibrillation (age of onset: 61) in his mother; Hypertension in his sister.    ROS: Please see the history of present illness. All other systems are reviewed and negative.    PHYSICAL EXAM: Filed Vitals:   06/10/15 0539  BP: 137/98  Pulse: 82  Temp: 97.9 F (36.6 C)  Resp: 18    GEN: Well nourished, well developed, in no acute distress  HEENT: normal  Neck: no JVD, carotid bruits, or masses Cardiac: iRRR; no murmurs, rubs, or gallops,no edema  Respiratory: clear to auscultation bilaterally, normal work of breathing GI: soft, nontender, nondistended, + BS MS: no deformity or atrophy  Skin: warm and dry  Neuro: Strength and sensation are intact Psych: euthymic mood, full affect    Lipid Panel   Labs (Brief)    No results found for: CHOL, TRIG, HDL, CHOLHDL, VLDL, LDLCALC, LDLDIRECT     Wt Readings from Last 3 Encounters:  04/19/15 192 lb 12.8 oz (87.454 kg)  01/12/15 198 lb (89.812 kg)  01/08/15 198 lb 3.2 oz (89.903 kg)      Other studies Reviewed: Additional studies/ records that were reviewed today include: cardiac CT reviewed with patient today, labs reviewed with he and wife also   ASSESSMENT AND PLAN:  1. Paroxysmal atrial fibrillation  Failed medical therapy with diltiazem and felcainide S/p prior afib ablation by Dr Sampson Goon with CTI ablation at  the same time. Therapeutic strategies for afib including medicine and ablation were discussed in detail with the patient today. Risk, benefits, and alternatives to EP study and radiofrequency ablation for afib were also discussed in detail today. These risks include but are not limited to stroke, bleeding, vascular damage, tamponade, perforation, damage to the esophagus, lungs, and other structures,  pulmonary vein stenosis, worsening renal function, and death. The patient understands these risk and wishes to proceed.  Cardiac CT reviewed. Appendage looks good.  Reports compliance with xarelto.  Hillis RangeJames Tiyonna Sardinha MD, [facility] 06/10/2015 7:06 AM

## 2015-06-10 NOTE — Anesthesia Postprocedure Evaluation (Signed)
Anesthesia Post Note  Patient: Reginald Rich Both  Procedure(s) Performed: Procedure(s) (LRB): Atrial Fibrillation Ablation (N/A)  Patient location during evaluation: PACU Anesthesia Type: General Level of consciousness: awake and alert Pain management: pain level controlled Vital Signs Assessment: post-procedure vital signs reviewed and stable Respiratory status: spontaneous breathing, nonlabored ventilation, respiratory function stable and patient connected to nasal cannula oxygen Cardiovascular status: blood pressure returned to baseline and stable Postop Assessment: no signs of nausea or vomiting Anesthetic complications: no    Last Vitals:  Filed Vitals:   06/10/15 1310 06/10/15 1340  BP: 127/54 135/83  Pulse: 86 73  Temp:    Resp: 14 19    Last Pain: There were no vitals filed for this visit.               [name] DAVID

## 2015-06-10 NOTE — Anesthesia Procedure Notes (Signed)
Procedure Name: LMA Insertion Date/Time: 06/10/2015 7:41 AM Performed by: Marena ChancyBECKNER, Breeanna Galgano S Pre-anesthesia Checklist: Patient identified, Timeout performed, Emergency Drugs available, Suction available and Patient being monitored Patient Re-evaluated:Patient Re-evaluated prior to inductionOxygen Delivery Method: Circle system utilized Preoxygenation: Pre-oxygenation with 100% oxygen Intubation Type: IV induction LMA: LMA inserted LMA Size: 5.0 Placement Confirmation: breath sounds checked- equal and bilateral and positive ETCO2 Tube secured with: Tape Dental Injury: Teeth and Oropharynx as per pre-operative assessment

## 2015-06-11 ENCOUNTER — Encounter (HOSPITAL_COMMUNITY): Payer: Self-pay | Admitting: Internal Medicine

## 2015-06-11 ENCOUNTER — Other Ambulatory Visit: Payer: Self-pay | Admitting: Internal Medicine

## 2015-06-11 DIAGNOSIS — I10 Essential (primary) hypertension: Secondary | ICD-10-CM | POA: Diagnosis not present

## 2015-06-11 DIAGNOSIS — I480 Paroxysmal atrial fibrillation: Secondary | ICD-10-CM

## 2015-06-11 MED ORDER — PANTOPRAZOLE SODIUM 40 MG PO TBEC: 40.0000 mg | DELAYED_RELEASE_TABLET | Freq: Every day | ORAL | Status: DC

## 2015-06-11 NOTE — Progress Notes (Signed)
Discharge instructions given to patient and signed copy placed in chart. Patient has no questions or concerns at this time. Belongings returned and patient escorted out with NT in wheelchair.

## 2015-06-11 NOTE — Discharge Summary (Signed)
ELECTROPHYSIOLOGY PROCEDURE DISCHARGE SUMMARY    Patient ID: Reginald Rich,  MRN: [id number], DOB/AGE: 49-Jan-1967 49 y.o.  Admit date: 06/10/2015 Discharge date: 06/11/2015  Primary Care Physician: Dr. Thomos Lemons, DO Electrophysiologist: Hillis Range, MD  Primary Discharge Diagnosis:  Paroxysmal AFib (prior AFib ablation with Dr. Peggye Form)  Secondary Discharge Diagnosis:  1. HTN  Procedures This Admission:  1.  Electrophysiology study and radiofrequency catheter ablation on 06/10/15 by Dr Hillis Range.  This study demonstrated   CONCLUSIONS: 1. Sinus rhythm upon presentation.  2. Return of electrical activity within the left inferior pulmonary vein. This was felt to be the culprit for his recurrence of afib. There was also a small amount of conduction within the left superior pulmonary vein. The right superior and inferior pulmonary veins were quiescent from the prior ablation procedure.  3. Successful electrical isolation and anatomical encircling of the left inferior and left superior pulmonary veins using WACA technique. 4. CTI block demonstrated from a prior ablation 5. No inducible arrhythmias following ablation both on and off of dobutamine. Adenosine challenge also performed 6. No early apparent complications.   Brief HPI: Malakye ODAI WIMMER is a 49 y.o. male with a history of paroxysmal atrial fibrillation.  They have failed medical therapy with flecainide and diltiazem. Risks, benefits, and alternatives to catheter ablation of atrial fibrillation were reviewed with the patient who wished to proceed.  The patient underwent cardiac CT prior to the procedure which demonstrated d no LAA thrombus filling defect    Hospital Course:  The patient was admitted and underwent EPS/RFCA of atrial fibrillation with details as outlined above.  They were monitored on telemetry overnight which demonstrated sinus rhythm.  Groin was without complication on the day of discharge.   The patient was examined and considered to be stable for discharge.  Wound care and restrictions were reviewed with the patient.  The patient will be seen back by Rudi Coco, NP in 4 weeks and Dr Johney Frame in 12 weeks for post ablation follow up.   This patients CHA2DS2-VASc Score is 1, on Xarelto, to continue uninterrupted  Physical Exam: Filed Vitals:   06/10/15 2215 06/11/15 0000 06/11/15 0415 06/11/15 0418  BP: 126/75   131/75  Pulse:      Temp:  97.6 F (36.4 C) 98.7 F (37.1 C)   TempSrc:  Axillary Oral   Resp:      Height:      Weight:      SpO2:        GEN- The patient is well appearing, alert and oriented x 3 today.   HEENT: normocephalic, atraumatic; sclera clear, conjunctiva pink; hearing intact; oropharynx clear; neck supple  Lungs- Clear to ausculation bilaterally, normal work of breathing.  No wheezes, rales, rhonchi Heart- Regular rate and rhythm, no murmurs, rubs or gallops  GI- soft, non-tender, non-distended, bowel sounds present  Extremities- no clubbing, cyanosis, or edema; DP/PT/radial pulses 2+ bilaterally, groin without hematoma/bruit MS- no significant deformity or atrophy Skin- warm and dry, no rash or lesion Psych- euthymic mood, full affect Neuro- strength and sensation are intact   Labs:   Lab Results  Component Value Date   WBC 7.0 06/03/2015   HGB 16.5 06/03/2015   HCT 45.8 06/03/2015   MCV 85.6 06/03/2015   PLT 195 06/03/2015   No results for input(s): NA, K, CL, CO2, BUN, CREATININE, CALCIUM, PROT, BILITOT, ALKPHOS, ALT, AST, GLUCOSE in the last 168 hours.  Invalid input(s): LABALBU   Discharge Medications:  Medication List    TAKE these medications        clonazePAM 0.5 MG tablet  Commonly known as:  KLONOPIN  TAKE 1 TABLET BY MOUTH EVERY NIGHT AT BEDTIME AS NEEDED FOR ANXIETY     diltiazem 30 MG tablet  Commonly known as:  CARDIZEM  take 1 tablet by mouth as needed every 6 hours for fast heart rates     escitalopram 10 MG  tablet  Commonly known as:  LEXAPRO  Take 10 mg by mouth daily.     escitalopram 10 MG tablet  Commonly known as:  LEXAPRO  TAKE 1 TABLET BY MOUTH EVERY DAY     flecainide 100 MG tablet  Commonly known as:  TAMBOCOR  Take 1 tablet (100 mg total) by mouth 2 (two) times daily.     pantoprazole 40 MG tablet  Commonly known as:  PROTONIX  Take 1 tablet (40 mg total) by mouth daily.     rivaroxaban 20 MG Tabs tablet  Commonly known as:  XARELTO  Take 1 tablet (20 mg total) by mouth daily with supper.     traZODone 50 MG tablet  Commonly known as:  DESYREL  Take 50 mg by mouth at bedtime.        Disposition: Home  Follow-up Information    Follow up with [facility] On 07/06/2015.   Specialty:  Cardiology   Why:  10:00 AM   Contact information:   [address] [id number] mc [place] [phone]      Follow up with Hillis RangeJames Casie Sturgeon, MD On 09/10/2015.   Specialty:  Cardiology   Why:  11:00AM   Contact information:   [address] [place] [address] [phone]       Duration of Discharge Encounter: Greater than 30 minutes including physician time.  Signed, Francis DowseRenee Ursuy, PA-C 06/11/2015 9:05 AM  I have seen, examined the patient, and reviewed the above assessment and plan.  On exam, RRR.  Groin without hematoma/ bruit.  Changes to above are made where necessary.    Co Sign: Hillis RangeJames Doris Mcgilvery, MD 06/11/2015 9:05 AM

## 2015-06-29 ENCOUNTER — Telehealth: Payer: Self-pay | Admitting: Internal Medicine

## 2015-06-29 NOTE — Telephone Encounter (Signed)
[facility] I faxed the ROI placed in chart prep bin.

## 2015-07-06 ENCOUNTER — Inpatient Hospital Stay (HOSPITAL_COMMUNITY): Admit: 2015-07-06 | Payer: 59 | Admitting: Nurse Practitioner

## 2015-07-08 ENCOUNTER — Ambulatory Visit (HOSPITAL_COMMUNITY)
Admission: RE | Admit: 2015-07-08 | Discharge: 2015-07-08 | Disposition: A | Discharge status: Home / Self Care | Payer: 59 | Source: Ambulatory Visit | Attending: Nurse Practitioner | Admitting: Nurse Practitioner

## 2015-07-08 ENCOUNTER — Inpatient Hospital Stay (HOSPITAL_COMMUNITY)
Admission: RE | Admit: 2015-07-08 | Payer: BLUE CROSS/BLUE SHIELD | Source: Ambulatory Visit | Admitting: Nurse Practitioner

## 2015-07-08 ENCOUNTER — Encounter (HOSPITAL_COMMUNITY): Payer: Self-pay | Admitting: Nurse Practitioner

## 2015-07-08 VITALS — BP 144/88 | HR 77 | Ht 73.0 in | Wt 205.4 lb

## 2015-07-08 DIAGNOSIS — I480 Paroxysmal atrial fibrillation: Secondary | ICD-10-CM | POA: Diagnosis present

## 2015-07-08 DIAGNOSIS — I10 Essential (primary) hypertension: Secondary | ICD-10-CM | POA: Insufficient documentation

## 2015-07-08 NOTE — Progress Notes (Signed)
Patient ID: Reginald Rich, male   DOB: June 01, 1966, 50 y.o.   MRN: [id number]     Primary Care Physician: No primary care provider on file. Referring Physician: Dr. Jodell CiproAllred   Reginald Rich is a 50 y.o. male with a h/o persistent afib s/p ablation 12/8. He reports that he is doing very well. He has not noticed any afib. He is back at the gym and is running and lifting weights and feels good. He denies any dysphagia, rt groin pain. He continues on xarelto.  Today, he denies symptoms of palpitations, chest pain, shortness of breath, orthopnea, PND, lower extremity edema, dizziness, presyncope, syncope, or neurologic sequela. The patient is tolerating medications without difficulties and is otherwise without complaint today.   Past Medical History  Diagnosis Date  . Kidney calculi   . Hypertension   . Paroxysmal atrial fibrillation (HCC)   . Raynaud's phenomenon associated with beta blocker use    Past Surgical History  Procedure Laterality Date  . Afib ablation  2008    [facility] by Dr Sampson GoonFitzgerald  . Electrophysiologic study N/A 06/10/2015    Procedure: Atrial Fibrillation Ablation;  Surgeon: Hillis RangeJames Allred, MD;  [facility];  Service: Cardiovascular;  Laterality: N/A;    Current Outpatient Prescriptions  Medication Sig Dispense Refill  . clonazePAM (KLONOPIN) 0.5 MG tablet TAKE 1 TABLET BY MOUTH EVERY NIGHT AT BEDTIME AS NEEDED FOR ANXIETY 30 tablet 2  . diltiazem (CARDIZEM) 30 MG tablet take 1 tablet by mouth as needed every 6 hours for fast heart rates 30 tablet 3  . escitalopram (LEXAPRO) 10 MG tablet TAKE 1 TABLET BY MOUTH EVERY DAY 90 tablet 1  . pantoprazole (PROTONIX) 40 MG tablet Take 1 tablet (40 mg total) by mouth daily. 45 tablet 0  . rivaroxaban (XARELTO) 20 MG TABS tablet Take 1 tablet (20 mg total) by mouth daily with supper. 30 tablet 11  . traZODone (DESYREL) 50 MG tablet Take 50 mg by mouth at bedtime.    . flecainide (TAMBOCOR) 100 MG tablet  Take 1 tablet (100 mg total) by mouth 2 (two) times daily. (Patient not taking: Reported on 06/04/2015) 180 tablet 3   No current facility-administered medications for this encounter.    No Known Allergies  Social History   Social History  . Marital Status: Divorced    Spouse Name: N/A  . Number of Children: N/A  . Years of Education: N/A   Occupational History  . Engineer    Social History Main Topics  . Smoking status: Never Smoker   . Smokeless tobacco: Not on file  . Alcohol Use: Yes  . Drug Use: No  . Sexual Activity: Not on file   Other Topics Concern  . Not on file   Social History Narrative   Works as Art gallery managerengineer for [employer]   Married - 2 sons    Family History  Problem Relation Age of Onset  . Atrial fibrillation Mother 2965  . Hypertension Sister   . Atrial fibrillation Maternal Grandfather     ROS- All systems are reviewed and negative except as per the HPI above  Physical Exam: Filed Vitals:   07/08/15 0857  BP: 144/88  Pulse: 77  Height: 6\' 1"  (1.854 m)  Weight: 205 lb 6.4 oz (93.169 kg)    GEN- The patient is well appearing, alert and oriented x 3 today.   Head- normocephalic, atraumatic Eyes-  Sclera clear, conjunctiva pink Ears- hearing intact Oropharynx- clear Neck-  supple, no JVP Lymph- no cervical lymphadenopathy Lungs- Clear to ausculation bilaterally, normal work of breathing Heart- Regular rate and rhythm, no murmurs, rubs or gallops, PMI not laterally displaced GI- soft, NT, ND, + BS Extremities- no clubbing, cyanosis, or edema MS- no significant deformity or atrophy Skin- no rash or lesion Psych- euthymic mood, full affect Neuro- strength and sensation are intact  EKG-NSR, Normal EKG at 77 bpm, pr int 170 ms, qrs int 90 ms, Qtc 409 ms Epic records reviewed  Assessment and Plan: 1. afib s/p abaltion Doing well wihout afib. Continue xarelto for at least 3 months after procedure  2. HTN Borderline, continue to watch Limit  salt  F/u with Dr. Johney Frame 3/10 afib clinic as needed  Lupita Leash C[facility] [address] [place], [place] [address] [phone]

## 2015-07-19 ENCOUNTER — Encounter: Payer: Self-pay | Admitting: Internal Medicine

## 2015-07-21 ENCOUNTER — Ambulatory Visit: Payer: 59 | Admitting: Internal Medicine

## 2015-08-13 ENCOUNTER — Encounter: Payer: Self-pay | Admitting: Internal Medicine

## 2015-08-31 ENCOUNTER — Ambulatory Visit (INDEPENDENT_AMBULATORY_CARE_PROVIDER_SITE_OTHER): Payer: 59 | Admitting: Internal Medicine

## 2015-08-31 ENCOUNTER — Encounter: Payer: Self-pay | Admitting: Internal Medicine

## 2015-08-31 VITALS — BP 124/78 | HR 92 | Ht 73.0 in | Wt 207.0 lb

## 2015-08-31 DIAGNOSIS — I10 Essential (primary) hypertension: Secondary | ICD-10-CM | POA: Diagnosis not present

## 2015-08-31 DIAGNOSIS — I480 Paroxysmal atrial fibrillation: Secondary | ICD-10-CM

## 2015-08-31 NOTE — Patient Instructions (Signed)
Medication Instructions:  Your physician has recommended you make the following change in your medication:  1) Stop Xarelto in 1 week   Labwork: None ordered   Testing/Procedures: None ordered   Follow-Up: Your physician recommends that you schedule a follow-up appointment in: 3 months with Dr Johney Frame   Any Other Special Instructions Will Be Listed Below (If Applicable).     If you need a refill on your cardiac medications before your next appointment, please call your pharmacy.

## 2015-08-31 NOTE — Progress Notes (Signed)
Reginald Rich is a 50 y.o. male who presents today for routine electrophysiology followup.  Since his recent ablation, the patient reports doing very well.  Denies procedure related complications.  Rare palpitations but has not had afib post ablation.  Today, he denies symptoms of chest pain, shortness of breath,  lower extremity edema, dizziness, presyncope, or syncope.  The patient is otherwise without complaint today.   Past Medical History  Diagnosis Date  . Kidney calculi   . Hypertension   . Paroxysmal atrial fibrillation (HCC)   . Raynaud's phenomenon associated with beta blocker use    Past Surgical History  Procedure Laterality Date  . Afib ablation  2008    [facility] by Dr Reginald Rich  . Electrophysiologic study N/A 06/10/2015    Atrial fibrillation ablation by Dr Reginald Rich    ROS- all systems are reviewed and negatives except as per HPI above  Current Outpatient Prescriptions  Medication Sig Dispense Refill  . rivaroxaban (XARELTO) 20 MG TABS tablet Take 1 tablet (20 mg total) by mouth daily with supper. 30 tablet 11  . traZODone (DESYREL) 50 MG tablet Take 50 mg by mouth at bedtime.     No current facility-administered medications for this visit.    Physical Exam: Filed Vitals:   08/31/15 1531  BP: 124/78  Pulse: 92  Height:  (1.854 m)  Weight: 207 lb (93.895 kg)    GEN- The patient is well appearing, alert and oriented x 3 today.   Head- normocephalic, atraumatic Eyes-  Sclera clear, conjunctiva pink Ears- hearing intact Oropharynx- clear Lungs- Clear to ausculation bilaterally, normal work of breathing Heart- Regular rate and rhythm, no murmurs, rubs or gallops, PMI not laterally displaced GI- soft, NT, ND, + BS Extremities- no clubbing, cyanosis, or edema  ekg today reveals sinus rhythm rsr'  Assessment and Plan:  1. afib  Doing well s/p ablation chads2vasc score is 1.  Will stop xarelto in 1 week (will be 3 months post ablation)  2.  htn Stable No change required today  Return to see me in 3 months  Hillis Range MD, [facility] 08/31/2015 3:53 PM

## 2015-09-03 ENCOUNTER — Ambulatory Visit: Payer: 59 | Admitting: Internal Medicine

## 2015-09-10 ENCOUNTER — Ambulatory Visit: Payer: BLUE CROSS/BLUE SHIELD | Admitting: Internal Medicine

## 2015-09-10 ENCOUNTER — Ambulatory Visit: Payer: 59 | Admitting: Internal Medicine

## 2015-10-18 ENCOUNTER — Ambulatory Visit: Payer: 59 | Admitting: Internal Medicine

## 2015-12-17 ENCOUNTER — Ambulatory Visit: Payer: 59 | Admitting: Internal Medicine

## 2017-01-21 IMAGING — CT CT HEART MORP W/ CTA COR W/ SCORE W/ CA W/CM &/OR W/O CM
1 of 10 series · 1 of 20 positions shown, 2 images · IV contrast (Iodine)
Comparison: No priors.

CLINICAL DATA: 49-year-old male with atrial fibrillation scheduled
for an ablation. Evaluate pulmonary veins anatomy.

EXAM:
Cardiac/Coronary  CT
TECHNIQUE: The patient was scanned on a Philips 256 scanner.

[Series 200: locator · axial · 0.35mm/px · z∈[-98,-98]mm · 1 of 1 slices shown, 2 images]
[im 1/1  vessel]
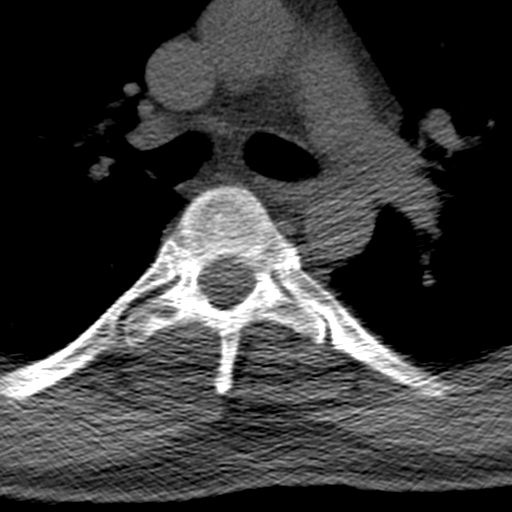
[im 1/1  lung]
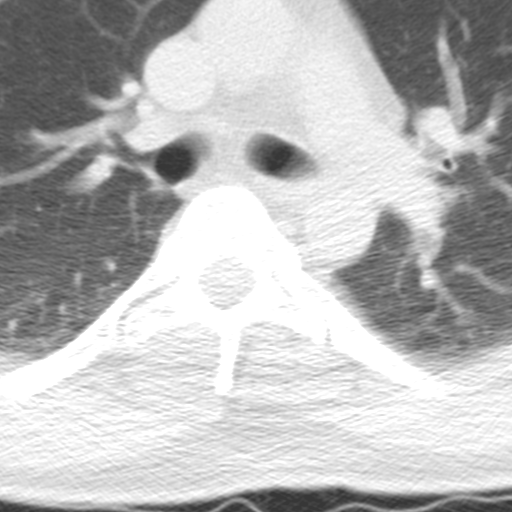

[1 of 20 positions shown; findings below may reference images not displayed]

FINDINGS: A 120 kV prospective scan was triggered in the descending thoracic
aorta at 111 HU's. Axial non-contrast 3 mm slices were carried out
through the heart. The data set was analyzed on a dedicated work
station and scored using the Agatson method. Gantry rotation speed
was 270 msecs and collimation was .9 mm. 5 mg of iv metoprolol and
no NTG was given. The 3D data set was reconstructed in 5% intervals
of the 67-82 % of the R-R cycle. Diastolic phases were analyzed on a
dedicated work station using MPR, MIP and VRT modes. The patient
received 80 cc of contrast.

The left atrial appendage is a mixed chicken wing and broccoli type
with two main lobes. It is very large appendage with ostial size 28
x 18 mm and length 41 mm. There is no filling defect.

Pulmonary veins drain normally into the left atrium (two on the
right and two on the left). The ostial measurements are as follows:

RUPV:  20 x 17 mm

RLPV:  21 x 15 mm

LUPV:  22 x 12 mm

LLPV:  18 x 14 mm

The esophagus run to the left from midline in the proximity to the
common origin of the left pulmonary veins.

There is a persistent foramen ovale (PFO) but no ASD.

Aorta:  Normal caliber, no calcifications, no dissection.

Aortic Valve:  Trileaflet, no calcifications.

Coronary Arteries: Normal coronary origin. Left dominance. The study
performed without use of NTG and not technically sufficient for
coronary evaluation.
IMPRESSION: 1. The left atrial appendage is a mixed chicken wing and broccoli
type with two main lobes. It is very large appendage with ostial
size 28 x 18 mm and length 41 mm. There is no filling defect.

2. Pulmonary veins drain normally into the left atrium (two on the
right and two on the left).

3. The esophagus run to the left from midline in the proximity to
the common origin of the left pulmonary veins.

4. There is a persistent foramen ovale (PFO) but no ASD.

Paulus N Ceejay

EXAM:
OVER-READ INTERPRETATION  CT CHEST

The following report is an over-read performed by radiologist Dr.
over-read does not include interpretation of cardiac or coronary
anatomy or pathology. The coronary calcium score/coronary CTA
interpretation by the cardiologist is attached.
FINDINGS: Within the visualized portions of the thorax there are no suspicious
appearing pulmonary nodules or masses, there is no acute
consolidative airspace disease, no pleural effusions, no
pneumothorax and no lymphadenopathy. Several calcified left hilar
lymph nodes are incidentally noted. Visualized portions of the upper
abdomen are unremarkable. There are no aggressive appearing lytic or
blastic lesions noted in the visualized portions of the skeleton.
IMPRESSION: 1. No significant incidental noncardiac findings noted.

## 2019-07-02 ENCOUNTER — Ambulatory Visit: Payer: BC Managed Care – PPO | Attending: Internal Medicine

## 2019-07-02 DIAGNOSIS — Z20822 Contact with and (suspected) exposure to covid-19: Secondary | ICD-10-CM

## 2019-07-03 LAB — NOVEL CORONAVIRUS, NAA: SARS-CoV-2, NAA: NOT DETECTED

## 2021-06-17 ENCOUNTER — Encounter (HOSPITAL_BASED_OUTPATIENT_CLINIC_OR_DEPARTMENT_OTHER): Payer: Self-pay | Admitting: *Deleted

## 2021-06-17 ENCOUNTER — Other Ambulatory Visit: Payer: Self-pay

## 2021-06-17 ENCOUNTER — Emergency Department (HOSPITAL_BASED_OUTPATIENT_CLINIC_OR_DEPARTMENT_OTHER): Payer: 59

## 2021-06-17 ENCOUNTER — Inpatient Hospital Stay (HOSPITAL_BASED_OUTPATIENT_CLINIC_OR_DEPARTMENT_OTHER)
Admission: EM | Admit: 2021-06-17 | Discharge: 2021-06-21 | DRG: 603 | Disposition: A | Discharge status: Home / Self Care | Payer: 59 | Attending: Family Medicine | Admitting: Family Medicine

## 2021-06-17 DIAGNOSIS — L03211 Cellulitis of face: Secondary | ICD-10-CM | POA: Diagnosis not present

## 2021-06-17 DIAGNOSIS — I4819 Other persistent atrial fibrillation: Secondary | ICD-10-CM | POA: Diagnosis present

## 2021-06-17 DIAGNOSIS — L0390 Cellulitis, unspecified: Secondary | ICD-10-CM | POA: Diagnosis present

## 2021-06-17 DIAGNOSIS — F411 Generalized anxiety disorder: Secondary | ICD-10-CM | POA: Diagnosis present

## 2021-06-17 DIAGNOSIS — R002 Palpitations: Secondary | ICD-10-CM | POA: Diagnosis present

## 2021-06-17 DIAGNOSIS — I4891 Unspecified atrial fibrillation: Secondary | ICD-10-CM | POA: Diagnosis present

## 2021-06-17 DIAGNOSIS — R509 Fever, unspecified: Secondary | ICD-10-CM

## 2021-06-17 DIAGNOSIS — I10 Essential (primary) hypertension: Secondary | ICD-10-CM | POA: Diagnosis present

## 2021-06-17 DIAGNOSIS — Z79899 Other long term (current) drug therapy: Secondary | ICD-10-CM

## 2021-06-17 DIAGNOSIS — A46 Erysipelas: Secondary | ICD-10-CM | POA: Diagnosis present

## 2021-06-17 DIAGNOSIS — Z8249 Family history of ischemic heart disease and other diseases of the circulatory system: Secondary | ICD-10-CM

## 2021-06-17 DIAGNOSIS — T380X5A Adverse effect of glucocorticoids and synthetic analogues, initial encounter: Secondary | ICD-10-CM | POA: Diagnosis present

## 2021-06-17 DIAGNOSIS — I493 Ventricular premature depolarization: Secondary | ICD-10-CM | POA: Diagnosis present

## 2021-06-17 DIAGNOSIS — Q2112 Patent foramen ovale: Secondary | ICD-10-CM

## 2021-06-17 DIAGNOSIS — K760 Fatty (change of) liver, not elsewhere classified: Secondary | ICD-10-CM | POA: Diagnosis present

## 2021-06-17 DIAGNOSIS — Z20822 Contact with and (suspected) exposure to covid-19: Secondary | ICD-10-CM | POA: Diagnosis present

## 2021-06-17 LAB — CBC WITH DIFFERENTIAL/PLATELET
Abs Immature Granulocytes: 0.07 10*3/uL (ref 0.00–0.07)
Basophils Absolute: 0.1 10*3/uL (ref 0.0–0.1)
Basophils Relative: 1 %
Eosinophils Absolute: 0 10*3/uL (ref 0.0–0.5)
Eosinophils Relative: 0 %
HCT: 49.5 % (ref 39.0–52.0)
Hemoglobin: 17 g/dL (ref 13.0–17.0)
Immature Granulocytes: 1 %
Lymphocytes Relative: 2 %
Lymphs Abs: 0.3 10*3/uL — ABNORMAL LOW (ref 0.7–4.0)
MCH: 29.4 pg (ref 26.0–34.0)
MCHC: 34.3 g/dL (ref 30.0–36.0)
MCV: 85.6 fL (ref 80.0–100.0)
Monocytes Absolute: 0.6 10*3/uL (ref 0.1–1.0)
Monocytes Relative: 4 %
Neutro Abs: 12.3 10*3/uL — ABNORMAL HIGH (ref 1.7–7.7)
Neutrophils Relative %: 92 %
Platelets: 194 10*3/uL (ref 150–400)
RBC: 5.78 MIL/uL (ref 4.22–5.81)
RDW: 12.7 % (ref 11.5–15.5)
WBC: 13.2 10*3/uL — ABNORMAL HIGH (ref 4.0–10.5)
nRBC: 0 % (ref 0.0–0.2)

## 2021-06-17 LAB — COMPREHENSIVE METABOLIC PANEL
ALT: 81 U/L — ABNORMAL HIGH (ref 0–44)
AST: 87 U/L — ABNORMAL HIGH (ref 15–41)
Albumin: 4.4 g/dL (ref 3.5–5.0)
Alkaline Phosphatase: 97 U/L (ref 38–126)
Anion gap: 10 (ref 5–15)
BUN: 15 mg/dL (ref 6–20)
CO2: 25 mmol/L (ref 22–32)
Calcium: 10.3 mg/dL (ref 8.9–10.3)
Chloride: 97 mmol/L — ABNORMAL LOW (ref 98–111)
Creatinine, Ser: 1.15 mg/dL (ref 0.61–1.24)
GFR, Estimated: >60 mL/min (ref >60)
Glucose, Bld: 185 mg/dL — ABNORMAL HIGH (ref 70–99)
Potassium: 3.8 mmol/L (ref 3.5–5.1)
Sodium: 132 mmol/L — ABNORMAL LOW (ref 135–145)
Total Bilirubin: 1.2 mg/dL (ref 0.3–1.2)
Total Protein: 8.4 g/dL — ABNORMAL HIGH (ref 6.5–8.1)

## 2021-06-17 LAB — RESP PANEL BY RT-PCR (FLU A&B, COVID) ARPGX2
Influenza A by PCR: NEGATIVE
Influenza B by PCR: NEGATIVE
SARS Coronavirus 2 by RT PCR: NEGATIVE

## 2021-06-17 LAB — LACTIC ACID, PLASMA: Lactic Acid, Venous: 1.3 mmol/L (ref 0.5–1.9)

## 2021-06-17 MED ORDER — SODIUM CHLORIDE 0.9 % IV SOLN
2.0000 g | Freq: Once | INTRAVENOUS | Status: AC
  Administered 2021-06-17: 2 g via INTRAVENOUS
  Filled 2021-06-17: qty 20

## 2021-06-17 MED ORDER — ONDANSETRON HCL 4 MG PO TABS: 4.0000 mg | ORAL_TABLET | Freq: Four times a day (QID) | ORAL | Status: DC | PRN

## 2021-06-17 MED ORDER — HYDROCODONE-ACETAMINOPHEN 5-325 MG PO TABS: 1.0000 | ORAL_TABLET | ORAL | Status: DC | PRN

## 2021-06-17 MED ORDER — TRAZODONE HCL 50 MG PO TABS
50.0000 mg | ORAL_TABLET | Freq: Every day | ORAL | Status: DC
  Administered 2021-06-18 – 2021-06-20 (×3): 50 mg via ORAL
  Filled 2021-06-17 (×4): qty 1

## 2021-06-17 MED ORDER — ENOXAPARIN SODIUM 40 MG/0.4ML IJ SOSY
40.0000 mg | PREFILLED_SYRINGE | INTRAMUSCULAR | Status: DC
  Administered 2021-06-17 – 2021-06-19 (×3): 40 mg via SUBCUTANEOUS
  Filled 2021-06-17 (×3): qty 0.4

## 2021-06-17 MED ORDER — ZOLPIDEM TARTRATE 5 MG PO TABS
5.0000 mg | ORAL_TABLET | Freq: Every evening | ORAL | Status: DC | PRN
  Administered 2021-06-17 – 2021-06-20 (×4): 5 mg via ORAL
  Filled 2021-06-17 (×4): qty 1

## 2021-06-17 MED ORDER — VANCOMYCIN HCL IN DEXTROSE 1-5 GM/200ML-% IV SOLN
1000.0000 mg | Freq: Two times a day (BID) | INTRAVENOUS | Status: DC
Start: 2021-06-18 — End: 2021-06-18
  Administered 2021-06-18: 1000 mg via INTRAVENOUS
  Filled 2021-06-17: qty 200

## 2021-06-17 MED ORDER — IOHEXOL 300 MG/ML  SOLN
100.0000 mL | Freq: Once | INTRAMUSCULAR | Status: AC | PRN
  Administered 2021-06-17: 75 mL via INTRAVENOUS

## 2021-06-17 MED ORDER — METHYLPREDNISOLONE SODIUM SUCC 40 MG IJ SOLR
40.0000 mg | Freq: Two times a day (BID) | INTRAMUSCULAR | Status: DC
  Administered 2021-06-17 – 2021-06-19 (×5): 40 mg via INTRAVENOUS
  Filled 2021-06-17 (×5): qty 1

## 2021-06-17 MED ORDER — AMLODIPINE BESYLATE 5 MG PO TABS
5.0000 mg | ORAL_TABLET | Freq: Every day | ORAL | Status: DC
  Administered 2021-06-17 – 2021-06-18 (×2): 5 mg via ORAL
  Filled 2021-06-17 (×2): qty 1

## 2021-06-17 MED ORDER — PANTOPRAZOLE SODIUM 40 MG PO TBEC
40.0000 mg | DELAYED_RELEASE_TABLET | Freq: Every day | ORAL | Status: DC
  Administered 2021-06-17 – 2021-06-21 (×5): 40 mg via ORAL
  Filled 2021-06-17 (×5): qty 1

## 2021-06-17 MED ORDER — SODIUM CHLORIDE 0.9 % IV SOLN: 1.0000 g | Freq: Once | INTRAVENOUS | Status: DC

## 2021-06-17 MED ORDER — IBUPROFEN 200 MG PO TABS: 600.0000 mg | ORAL_TABLET | Freq: Four times a day (QID) | ORAL | Status: DC | PRN

## 2021-06-17 MED ORDER — VANCOMYCIN HCL 1750 MG/350ML IV SOLN
1750.0000 mg | Freq: Once | INTRAVENOUS | Status: AC
  Administered 2021-06-17: 1750 mg via INTRAVENOUS
  Filled 2021-06-17: qty 350

## 2021-06-17 MED ORDER — DOCUSATE SODIUM 100 MG PO CAPS
100.0000 mg | ORAL_CAPSULE | Freq: Two times a day (BID) | ORAL | Status: DC
  Administered 2021-06-17 – 2021-06-20 (×6): 100 mg via ORAL
  Filled 2021-06-17 (×8): qty 1

## 2021-06-17 MED ORDER — CEFTRIAXONE SODIUM 1 G IJ SOLR: 2.0000 g | Freq: Once | INTRAMUSCULAR | 0 refills | Status: DC

## 2021-06-17 MED ORDER — POLYETHYLENE GLYCOL 3350 17 G PO PACK: 17.0000 g | PACK | Freq: Every day | ORAL | Status: DC | PRN

## 2021-06-17 MED ORDER — ONDANSETRON HCL 4 MG/2ML IJ SOLN: 4.0000 mg | Freq: Four times a day (QID) | INTRAMUSCULAR | Status: DC | PRN

## 2021-06-17 MED ORDER — SODIUM CHLORIDE 0.9 % IV BOLUS
1000.0000 mL | Freq: Once | INTRAVENOUS | Status: AC
  Administered 2021-06-17: 1000 mL via INTRAVENOUS

## 2021-06-17 MED ORDER — ACETAMINOPHEN 650 MG RE SUPP: 650.0000 mg | Freq: Four times a day (QID) | RECTAL | Status: DC | PRN

## 2021-06-17 MED ORDER — ACETAMINOPHEN 500 MG PO TABS
1000.0000 mg | ORAL_TABLET | Freq: Once | ORAL | Status: AC
  Administered 2021-06-17: 1000 mg via ORAL
  Filled 2021-06-17: qty 2

## 2021-06-17 MED ORDER — IBUPROFEN 400 MG PO TABS
600.0000 mg | ORAL_TABLET | Freq: Once | ORAL | Status: AC
  Administered 2021-06-17: 600 mg via ORAL
  Filled 2021-06-17: qty 1

## 2021-06-17 MED ORDER — VANCOMYCIN HCL IN DEXTROSE 1-5 GM/200ML-% IV SOLN: 1000.0000 mg | Freq: Once | INTRAVENOUS | Status: DC

## 2021-06-17 MED ORDER — ACETAMINOPHEN 325 MG PO TABS
650.0000 mg | ORAL_TABLET | Freq: Four times a day (QID) | ORAL | Status: DC | PRN
  Administered 2021-06-17: 650 mg via ORAL
  Filled 2021-06-17: qty 2

## 2021-06-17 MED ORDER — SODIUM CHLORIDE 0.9 % IV SOLN: INTRAVENOUS | Status: DC

## 2021-06-17 MED ORDER — ONDANSETRON HCL 4 MG/2ML IJ SOLN
4.0000 mg | Freq: Once | INTRAMUSCULAR | Status: AC
  Administered 2021-06-17: 4 mg via INTRAVENOUS
  Filled 2021-06-17: qty 2

## 2021-06-17 NOTE — Progress Notes (Signed)
Pharmacy Antibiotic Note  Reginald Rich is a 55 y.o. male admitted on 06/17/2021 with cellulitis.  Presented with fever and facial swelling in the R ear and eyes.  Stated this began with a pimple in the area.  CT maxillofacial area showed soft tissue swelling and skin thickening likely reflecting cellulitis.  No evidence of post-septal extension or abscess.  Due to extensive nature of cellulitis and potential for rapid progression, pharmacy has been consulted for vancomycin dosing.  Plan: Vancomycin 1750mg  IV x1, followed by Vancomycin 1000mg  IV q12 hours (eAUC 422, Scr 1.15) Follow up renal function, ability to transition to PO antibiotics  Height: 6\' 1"  (185.4 cm) Weight: 90.7 kg (200 lb) IBW/kg (Calculated) : 79.9  Temp (24hrs), Avg:100.2 F (37.9 C), Min:99.9 F (37.7 C), Max:100.4 F (38 C)  Recent Labs  Lab 06/17/21 0856  WBC 13.2*  CREATININE 1.15  LATICACIDVEN 1.3    Estimated Creatinine Clearance: 82 mL/min (by C-G formula based on SCr of 1.15 mg/dL).    No Known Allergies  Antimicrobials this admission: Vancomycin 12/16 >>    Dose adjustments this admission:   Microbiology results: 12/16 BCx: sent   Thank you for allowing pharmacy to be a part of this patients care.  06/19/21, PharmD 06/17/2021 5:23 PM

## 2021-06-17 NOTE — ED Triage Notes (Signed)
Presents with fever, noted to have facial swelling, rt ear as well, both eyes swollen as well, denies any difficulty with swallowing and speech, no dyspnea. Alternating Ibuprofen and tylenol. Last dose at 0200hrs (Tylenol). A&O x 3, GCS 15

## 2021-06-17 NOTE — Progress Notes (Signed)
Patient arrived to room 1513 from [facility]. Pt is alert and oriented and has been oriented to unit. Patient flagged a yellow mews on admission VS. Christena Deem, RN notified. Will give PRN tylenol. Yellow MEWS protocol initiated.   06/17/21 1705  Assess: MEWS Score  Temp (!) 100.9 F (38.3 C)  BP 140/84  Pulse Rate (!) 110  Resp 16  SpO2 99 %  O2 Device Room Air  Assess: MEWS Score  MEWS Temp 1  MEWS Systolic 0  MEWS Pulse 1  MEWS RR 0  MEWS LOC 0  MEWS Score 2  MEWS Score Color Yellow  Assess: if the MEWS score is Yellow or Red  Were vital signs taken at a resting state? Yes  Focused Assessment No change from prior assessment  Does the patient meet 2 or more of the SIRS criteria? No  MEWS guidelines implemented *See Row Information* Yes  Treat  MEWS Interventions Administered prn meds/treatments  Pain Scale 0-10  Pain Type Acute pain  Pain Location Face  Pain Descriptors / Indicators Pressure  Pain Frequency Constant  Pain Onset On-going  Patients Stated Pain Goal 0  Pain Intervention(s) Medication (See eMAR)  Take Vital Signs  Increase Vital Sign Frequency  Yellow: Q 2hr X 2 then Q 4hr X 2, if remains yellow, continue Q 4hrs  Escalate  MEWS: Escalate Yellow: discuss with charge nurse/RN and consider discussing with provider and RRT  Notify: Charge Nurse/RN  Name of Charge Nurse/RN Notified Christena Deem, RN  Date Charge Nurse/RN Notified 06/17/21  Time Charge Nurse/RN Notified 1708  Document  Patient Outcome Other (Comment) (remains on unti)  Progress note created (see row info) Yes  Assess: SIRS CRITERIA  SIRS Temperature  0  SIRS Pulse 1  SIRS Respirations  0  SIRS WBC 0  SIRS Score Sum  1

## 2021-06-17 NOTE — H&P (Addendum)
[facility] History and Physical  Reginald Rich [id number] DOB: January 28, 1966 DOA: 06/17/2021  Referring physician: ED  PCP: Patient, No Pcp Per (Inactive)   Patient is coming from: Home  Chief Complaint: Facial swelling  HPI: Reginald Rich is a 55 y.o. male with past medical history of hypertension, paroxysmal atrial fibrillation, Raynaud's phenomena associated with beta-blocker usage presented to hospital with complaints of fever and facial swelling especially in the right ear with puffiness and swelling of the eyes.  Patient was alternating ibuprofen and Tylenol at home for this problem.  Patient stated that it started with a pimple on his nasal area, not sure which side 3 days back.  Morning patient had worsening swelling of his facial area including both eyes causing him difficulty seeing but no blurry vision.  Patient denies any trauma to the face..  No previous history of recurrent abscesses.  Complains mild loose stools today but no nausea vomiting.  Had some fever at home.  Denies any sinus problems in the past.  Denies any dizziness lightheadedness or syncope.  Denies any urinary ambulatory regurgitation.  Denies any cough, shortness of breath or dyspnea.  Denies any chest pain palpitation or dizziness.  ED Course: In the ED, patient had blood cultures drawn.  COVID and influenza was negative.  Sodium was low at 132.  Creatinine 1.1.  Lactate was 1.3.  Patient had mild leukocytosis with WBC at 13.2 with absolute neutrophil at 12.3.  CT of this maxillofacial area showed soft tissue swelling and skin thickening in the face and periorbital soft tissues right worse than the left likely reflecting cellulitis.  There was no evidence of post septal extension or abscess.  Metallic densities over the left mandibular body.  Patient received IV Rocephin in the ED.  Patient was then considered for admission to the hospital  Review of Systems:  All systems were reviewed and were  negative unless otherwise mentioned in the HPI  Past Medical History:  Diagnosis Date   Hypertension    Kidney calculi    Paroxysmal atrial fibrillation ([place])    Raynaud's phenomenon associated with beta blocker use    Past Surgical History:  Procedure Laterality Date   afib ablation  2008   [facility] by Dr Ola Spurr   ELECTROPHYSIOLOGIC STUDY N/A 06/10/2015   Atrial fibrillation ablation by Dr Rayann Heman    Social History:  reports that he has never smoked. He does not have any smokeless tobacco history on file. He reports current alcohol use. He reports that he does not use drugs.  No Known Allergies  Family History  Problem Relation Age of Onset   Atrial fibrillation Mother 45   Hypertension Sister    Atrial fibrillation Maternal Grandfather      Prior to Admission medications   Medication Sig Start Date End Date Taking? Authorizing Provider  traZODone (DESYREL) 50 MG tablet Take 50 mg by mouth at bedtime.    [provider]    Physical Exam: Vitals:   06/17/21 1500 06/17/21 1515 06/17/21 1524 06/17/21 1546  BP: 139/85 (!) 124/111  (!) 156/97  Pulse: 89   100  Resp: 20 (!) 24  16  Temp:   (!) 100.4 F (38 C)   TempSrc:   Oral   SpO2: 98%   98%  Weight:      Height:       Wt Readings from Last 3 Encounters:  06/17/21 90.7 kg  08/31/15 93.9 kg  07/08/15 93.2 kg  Body mass index is 26.39 kg/m.  General:  Average built, not in obvious distress HENT: Normocephalic, puffiness over the bilateral periorbital region but conjunctiva clear, erythema induration and edema of the bilateral cheek area with faint yellowish tint to the skin. Chest:  Clear breath sounds.  Diminished breath sounds bilaterally. No crackles or wheezes.  CVS: S1 &S2 heard. No murmur.  Regular rate and rhythm. Abdomen: Soft, nontender, nondistended.  Bowel sounds are heard.  Liver is not palpable, no abdominal mass palpated Extremities: No cyanosis, clubbing or edema.  Peripheral  pulses are palpable. Psych: Alert, awake and oriented, normal mood CNS:  No cranial nerve deficits.  Power equal in all extremities.    Skin: Warm and dry.  Facial cellulitis.     Labs on Admission:   CBC: Recent Labs  Lab 06/17/21 0856  WBC 13.2*  NEUTROABS 12.3*  HGB 17.0  HCT 49.5  MCV 85.6  PLT 194    Basic Metabolic Panel: Recent Labs  Lab 06/17/21 0856  NA 132*  K 3.8  CL 97*  CO2 25  GLUCOSE 185*  BUN 15  CREATININE 1.15  CALCIUM 10.3    Liver Function Tests: Recent Labs  Lab 06/17/21 0856  AST 87*  ALT 81*  ALKPHOS 97  BILITOT 1.2  PROT 8.4*  ALBUMIN 4.4   No results for input(s): LIPASE, AMYLASE in the last 168 hours. No results for input(s): AMMONIA in the last 168 hours.  Cardiac Enzymes: No results for input(s): CKTOTAL, CKMB, CKMBINDEX, TROPONINI in the last 168 hours.  BNP (last 3 results) No results for input(s): BNP in the last 8760 hours.  ProBNP (last 3 results) No results for input(s): PROBNP in the last 8760 hours.  CBG: No results for input(s): GLUCAP in the last 168 hours.  Lipase  No results found for: LIPASE   Urinalysis    Component Value Date/Time   COLORURINE YELLOW 09/17/2011 2139   APPEARANCEUR CLOUDY (A) 09/17/2011 2139   LABSPEC 1.017 09/17/2011 2139   PHURINE 5.0 09/17/2011 2139   GLUCOSEU NEGATIVE 09/17/2011 2139   HGBUR LARGE (A) 09/17/2011 2139   BILIRUBINUR NEGATIVE 09/17/2011 2139   KETONESUR NEGATIVE 09/17/2011 2139   PROTEINUR NEGATIVE 09/17/2011 2139   UROBILINOGEN 0.2 09/17/2011 2139   NITRITE NEGATIVE 09/17/2011 2139   LEUKOCYTESUR NEGATIVE 09/17/2011 2139     Drugs of Abuse  No results found for: LABOPIA, COCAINSCRNUR, LABBENZ, AMPHETMU, THCU, LABBARB    Radiological Exams on Admission: CT Maxillofacial W Contrast  Result Date: 06/17/2021 CLINICAL DATA:  Periorbital edema extending down to the mandible, fever EXAM: CT MAXILLOFACIAL WITH CONTRAST TECHNIQUE: Multidetector CT imaging of  the maxillofacial structures was performed with intravenous contrast. Multiplanar CT image reconstructions were also generated. CONTRAST:  75mL OMNIPAQUE IOHEXOL 300 MG/ML  SOLN COMPARISON:  None. FINDINGS: Osseous: There is no acute fracture. There is no suspicious osseous lesion. Mandibular alignment is normal. Orbits: Globes are intact. The extraocular muscles are normal. The orbital fat is preserved, without evidence of postseptal cellulitis. Sinuses: There is mild mucosal thickening in the paranasal sinuses with a prominent left maxillary sinus mucous retention cyst. Soft tissues: There is skin thickening and associated subcutaneous fat stranding in the face bilaterally, right worse than left, extending to the periorbital soft tissues and forehead. No organized fluid collection is seen. Bilateral palatine tonsilliths are noted, right more than left. The imaged salivary glands are normal. There is a linear metallic density foreign body measuring 6 mm in the soft tissues lateral  to the left mandibular body of uncertain origin. Limited intracranial: The imaged portions of the intracranial compartment are unremarkable. IMPRESSION: 1. Soft tissue swelling and skin thickening in the face and periorbital soft tissues, right worse than left, likely reflecting cellulitis. There is no evidence of postseptal extension or abscess formation. 2. Linear metallic density foreign bodies in the soft tissues lateral to the left mandibular body of uncertain origin. Correlate with any surgical history and physical exam. Electronically Signed   By: Lesia Hausen M.D.   On: 06/17/2021 11:03    EKG: Not available for review  Assessment/Plan Principal Problem:   Facial cellulitis Active Problems:   Essential hypertension   A-fib (HCC)   Facial cellulitis.  Possibility of erysipelas.  Started with a pimple over the nose.  Widespread at this time also periorbital puffiness bilateral edema erythema with yellowish pustular  looking skin area..  CT maxillofacial area however did not show any evidence of abscess.  Due to extensive nature of cellulitis and potential for rapid progression, we will continue with IV vancomycin for now.  Closely monitor.  Check MRSA swab, hemoglobin A1c.  HIV.  Continue on gentle IV fluid hydration for now.  Received 2 g of IV Rocephin in the ED today.  We will give IV Solu-Medrol due to extensive swelling.  Reassess in AM.  Would consider ID evaluation if not improved.  Essential hypertension.  Not on medication.  Usually 140 systolic at home as per the patient.  Spoke about potential antihypertensive as outpatient.  Atrial fibrillation. History of ablation for atrial fibrillation in the past, not on anticoagulation or any medication.  Raynaud's phenomena on beta-blocker.  Normal sinus rhythm at this time.  DVT Prophylaxis: Heparin subcu  Consultant: None  Code Status: Full code  Microbiology blood culture sent from the ED  Antibiotics: IV vancomycin  Family Communication:  Patients' condition and plan of care including tests being ordered have been discussed with the patient and the patient's wife at bedside who indicate understanding and agree with the plan.   Status is: Observation  The patient remains OBS appropriate and will d/c before 2 midnights.  Severity of Illness: The appropriate patient status for this patient is OBSERVATION. Observation status is judged to be reasonable and necessary in order to provide the required intensity of service to ensure the patient's safety. The patient's presenting symptoms, physical exam findings, and initial radiographic and laboratory data in the context of their medical condition is felt to place them at decreased risk for further clinical deterioration. Furthermore, it is anticipated that the patient will be medically stable for discharge from the hospital within 2 midnights of admission.   Signed, Joycelyn Das, MD [facility] 06/17/2021

## 2021-06-17 NOTE — ED Notes (Signed)
Blood Cultures x 2 obtained (set 1 at [facility], set 2 at Left [facility])

## 2021-06-17 NOTE — ED Notes (Signed)
Phone Handoff Report given to [employer] with [employer]

## 2021-06-17 NOTE — ED Provider Notes (Signed)
Pt awaiting admission for cellulitis.  It looks like Dr. Jodi Mourning accidentally ordered the ceftriaxone under the d/c meds and his initial nurse did not realize that he needed antibiotics.  Pt's nurse now realized that he has not gotten any abx and asked me if he should get some.  I ordered 2 g IV Rocephin to be given now.   Jacalyn Lefevre, MD 06/17/21 [phone]

## 2021-06-17 NOTE — ED Notes (Signed)
Report to [facility] at [facility]  1513

## 2021-06-17 NOTE — ED Provider Notes (Addendum)
[facility] Provider Note   CSN: [id number] Arrival date & time: 06/17/21  [id number]     History Chief Complaint  Patient presents with   Fever    Reginald Rich is a 55 y.o. male.  Patient with history of A. fib, high blood pressure presents with worsening facial redness, swelling, pain and fever since Wednesday.  Patient said it started with a pimple on his nose region.  Patient denies any history of significant MRSA or cellulitis.  Patient has nausea.  Symptoms gradually worsening.  No new exposures this week.  No significant allergies.      Past Medical History:  Diagnosis Date   Hypertension    Kidney calculi    Paroxysmal atrial fibrillation (HCC)    Raynaud's phenomenon associated with beta blocker use     Patient Active Problem List   Diagnosis Date Noted   A-fib ([place]) 06/10/2015   Generalized anxiety disorder 07/07/2013   Raynaud's phenomenon associated with beta blocker use    Insomnia 12/26/2012   Low libido 12/26/2012   Atrial fibrillation ([place]) 09/10/2012   Essential hypertension 09/10/2012   Hoarse 12/14/2011    Past Surgical History:  Procedure Laterality Date   afib ablation  2008   [facility] by Dr Ola Spurr   ELECTROPHYSIOLOGIC STUDY N/A 06/10/2015   Atrial fibrillation ablation by Dr Rayann Heman       Family History  Problem Relation Age of Onset   Atrial fibrillation Mother 37   Hypertension Sister    Atrial fibrillation Maternal Grandfather     Social History   Tobacco Use   Smoking status: Never  Substance Use Topics   Alcohol use: Yes   Drug use: No    Home Medications Prior to Admission medications   Medication Sig Start Date End Date Taking? Authorizing Provider  cefTRIAXone (ROCEPHIN) 1 g injection Inject 2 g into the muscle once for 1 dose. 06/17/21 06/17/21 Yes Elnora Morrison, MD  traZODone (DESYREL) 50 MG tablet Take 50 mg by mouth at bedtime.    [provider]    Allergies     Patient has no known allergies.  Review of Systems   Review of Systems  Constitutional:  Positive for fever. Negative for chills.  HENT:  Negative for congestion.   Eyes:  Negative for visual disturbance.  Respiratory:  Negative for shortness of breath.   Cardiovascular:  Negative for chest pain.  Gastrointestinal:  Positive for nausea. Negative for abdominal pain and vomiting.  Genitourinary:  Negative for dysuria and flank pain.  Musculoskeletal:  Negative for back pain, neck pain and neck stiffness.  Skin:  Positive for color change and rash.  Neurological:  Negative for light-headedness and headaches.   Physical Exam Updated Vital Signs BP 117/80    Pulse 88    Temp 99.9 F (37.7 C)    Resp 16    Ht 6\' 1"  (1.854 m)    Wt 90.7 kg    SpO2 94%    BMI 26.39 kg/m   Physical Exam Vitals and nursing note reviewed.  Constitutional:      General: He is not in acute distress.    Appearance: He is well-developed.  HENT:     Head: Normocephalic.     Comments: Patient has significant erythema with mild induration bilateral upper face extending periorbital worse on the right with mild purulence lid line inferior on the right.  Mild tender to palpation mild warmth.  No posterior pharyngeal edema.  No  trismus.  No significant lymphadenopathy anterior cervical.  Neck supple.  Full extraocular muscle function without pain.    Mouth/Throat:     Mouth: Mucous membranes are moist.  Eyes:     General:        Right eye: No discharge.        Left eye: No discharge.     Conjunctiva/sclera: Conjunctivae normal.  Neck:     Trachea: No tracheal deviation.  Cardiovascular:     Rate and Rhythm: Regular rhythm. Tachycardia present.     Heart sounds: No murmur heard. Pulmonary:     Effort: Pulmonary effort is normal.     Breath sounds: Normal breath sounds.  Abdominal:     General: There is no distension.     Palpations: Abdomen is soft.     Tenderness: There is no abdominal tenderness. There is  no guarding.  Musculoskeletal:     Cervical back: Normal range of motion and neck supple. No rigidity.  Skin:    General: Skin is warm.     Capillary Refill: Capillary refill takes less than 2 seconds.     Findings: Erythema and rash present.  Neurological:     General: No focal deficit present.     Mental Status: He is alert.     Cranial Nerves: No cranial nerve deficit.  Psychiatric:        Mood and Affect: Mood normal.    ED Results / Procedures / Treatments   Labs (all labs ordered are listed, but only abnormal results are displayed) Labs Reviewed  CBC WITH DIFFERENTIAL/PLATELET - Abnormal; Notable for the following components:      Result Value   WBC 13.2 (*)    Neutro Abs 12.3 (*)    Lymphs Abs 0.3 (*)    All other components within normal limits  COMPREHENSIVE METABOLIC PANEL - Abnormal; Notable for the following components:   Sodium 132 (*)    Chloride 97 (*)    Glucose, Bld 185 (*)    Total Protein 8.4 (*)    AST 87 (*)    ALT 81 (*)    All other components within normal limits  RESP PANEL BY RT-PCR (FLU A&B, COVID) ARPGX2  CULTURE, BLOOD (ROUTINE X 2)  CULTURE, BLOOD (ROUTINE X 2)  LACTIC ACID, PLASMA  LACTIC ACID, PLASMA    EKG None  Radiology CT Maxillofacial W Contrast  Result Date: 06/17/2021 CLINICAL DATA:  Periorbital edema extending down to the mandible, fever EXAM: CT MAXILLOFACIAL WITH CONTRAST TECHNIQUE: Multidetector CT imaging of the maxillofacial structures was performed with intravenous contrast. Multiplanar CT image reconstructions were also generated. CONTRAST:  70mL OMNIPAQUE IOHEXOL 300 MG/ML  SOLN COMPARISON:  None. FINDINGS: Osseous: There is no acute fracture. There is no suspicious osseous lesion. Mandibular alignment is normal. Orbits: Globes are intact. The extraocular muscles are normal. The orbital fat is preserved, without evidence of postseptal cellulitis. Sinuses: There is mild mucosal thickening in the paranasal sinuses with a  prominent left maxillary sinus mucous retention cyst. Soft tissues: There is skin thickening and associated subcutaneous fat stranding in the face bilaterally, right worse than left, extending to the periorbital soft tissues and forehead. No organized fluid collection is seen. Bilateral palatine tonsilliths are noted, right more than left. The imaged salivary glands are normal. There is a linear metallic density foreign body measuring 6 mm in the soft tissues lateral to the left mandibular body of uncertain origin. Limited intracranial: The imaged portions of the intracranial compartment  are unremarkable. IMPRESSION: 1. Soft tissue swelling and skin thickening in the face and periorbital soft tissues, right worse than left, likely reflecting cellulitis. There is no evidence of postseptal extension or abscess formation. 2. Linear metallic density foreign bodies in the soft tissues lateral to the left mandibular body of uncertain origin. Correlate with any surgical history and physical exam. Electronically Signed   By: Lesia Hausen M.D.   On: 06/17/2021 11:03    Procedures .Critical Care Performed by: Blane Ohara, MD Authorized by: Blane Ohara, MD   Critical care provider statement:    Critical care time (minutes):  35   Critical care start time:  06/17/2021 8:50 AM   Critical care end time:  06/17/2021 9:25 AM   Critical care time was exclusive of:  Separately billable procedures and treating other patients and teaching time   Critical care was time spent personally by me on the following activities:  Ordering and review of laboratory studies, examination of patient, re-evaluation of patient's condition, pulse oximetry and discussions with consultants   Care discussed with: admitting provider     Medications Ordered in ED Medications  ibuprofen (ADVIL) tablet 600 mg (600 mg Oral Given 06/17/21 0911)  sodium chloride 0.9 % bolus 1,000 mL (1,000 mLs Intravenous New Bag/Given 06/17/21 0910)   ondansetron (ZOFRAN) injection 4 mg (4 mg Intravenous Given 06/17/21 0911)  iohexol (OMNIPAQUE) 300 MG/ML solution 100 mL (75 mLs Intravenous Contrast Given 06/17/21 1020)    ED Course  I have reviewed the triage vital signs and the nursing notes.  Pertinent labs & imaging results that were available during my care of the patient were reviewed by me and considered in my medical decision making (see chart for details).    MDM Rules/Calculators/A&P                          Patient presents with significant cellulitis and periorbital edema.  Discussed likely skin infection, possibly early abscess.  No sign of orbital infection at this time.  Plan for blood work, cultures, IV antibiotics and transfer.  Patient blood work reviewed showing leukocytosis 13,000 with a shift, sodium 132, mild LFT elevation 87/81 respectively.  Lactic acid normal.  Heart rate and blood pressure normalized.  Patient received IV fluid bolus and IV antibiotics.  CT scan obtained no abscess, cellulitis.  With extent of facial cellulitis plan for further IV antibiotics and admission.  Paged hospitalist to discuss further.     Final Clinical Impression(s) / ED Diagnoses Final diagnoses:  Facial cellulitis  Fever in adult    Rx / DC Orders ED Discharge Orders          Ordered    cefTRIAXone (ROCEPHIN) 1 g injection   Once        06/17/21 0912             Blane Ohara, MD 06/17/21 1140    Blane Ohara, MD 07/07/21 [phone]

## 2021-06-17 NOTE — ED Notes (Signed)
[facility] Team at Bedside

## 2021-06-17 NOTE — ED Notes (Signed)
ED Provider at bedside in Triage

## 2021-06-18 DIAGNOSIS — Z20822 Contact with and (suspected) exposure to covid-19: Secondary | ICD-10-CM | POA: Diagnosis present

## 2021-06-18 DIAGNOSIS — I480 Paroxysmal atrial fibrillation: Secondary | ICD-10-CM | POA: Diagnosis not present

## 2021-06-18 DIAGNOSIS — Z8249 Family history of ischemic heart disease and other diseases of the circulatory system: Secondary | ICD-10-CM | POA: Diagnosis not present

## 2021-06-18 DIAGNOSIS — R002 Palpitations: Secondary | ICD-10-CM | POA: Diagnosis present

## 2021-06-18 DIAGNOSIS — Q2112 Patent foramen ovale: Secondary | ICD-10-CM | POA: Diagnosis not present

## 2021-06-18 DIAGNOSIS — T380X5A Adverse effect of glucocorticoids and synthetic analogues, initial encounter: Secondary | ICD-10-CM | POA: Diagnosis present

## 2021-06-18 DIAGNOSIS — F411 Generalized anxiety disorder: Secondary | ICD-10-CM | POA: Diagnosis present

## 2021-06-18 DIAGNOSIS — A46 Erysipelas: Secondary | ICD-10-CM | POA: Diagnosis present

## 2021-06-18 DIAGNOSIS — L03211 Cellulitis of face: Secondary | ICD-10-CM | POA: Diagnosis present

## 2021-06-18 DIAGNOSIS — I493 Ventricular premature depolarization: Secondary | ICD-10-CM | POA: Diagnosis present

## 2021-06-18 DIAGNOSIS — Z79899 Other long term (current) drug therapy: Secondary | ICD-10-CM | POA: Diagnosis not present

## 2021-06-18 DIAGNOSIS — I10 Essential (primary) hypertension: Secondary | ICD-10-CM | POA: Diagnosis present

## 2021-06-18 DIAGNOSIS — K760 Fatty (change of) liver, not elsewhere classified: Secondary | ICD-10-CM | POA: Diagnosis present

## 2021-06-18 DIAGNOSIS — L0390 Cellulitis, unspecified: Secondary | ICD-10-CM | POA: Diagnosis present

## 2021-06-18 DIAGNOSIS — I4891 Unspecified atrial fibrillation: Secondary | ICD-10-CM | POA: Diagnosis not present

## 2021-06-18 DIAGNOSIS — I4819 Other persistent atrial fibrillation: Secondary | ICD-10-CM | POA: Diagnosis present

## 2021-06-18 LAB — CBC
HCT: 42 % (ref 39.0–52.0)
Hemoglobin: 14.4 g/dL (ref 13.0–17.0)
MCH: 29.6 pg (ref 26.0–34.0)
MCHC: 34.3 g/dL (ref 30.0–36.0)
MCV: 86.4 fL (ref 80.0–100.0)
Platelets: 187 10*3/uL (ref 150–400)
RBC: 4.86 MIL/uL (ref 4.22–5.81)
RDW: 12.6 % (ref 11.5–15.5)
WBC: 15.4 10*3/uL — ABNORMAL HIGH (ref 4.0–10.5)
nRBC: 0 % (ref 0.0–0.2)

## 2021-06-18 LAB — MAGNESIUM: Magnesium: 1.7 mg/dL (ref 1.7–2.4)

## 2021-06-18 LAB — BASIC METABOLIC PANEL
Anion gap: 9 (ref 5–15)
BUN: 12 mg/dL (ref 6–20)
CO2: 20 mmol/L — ABNORMAL LOW (ref 22–32)
Calcium: 8.4 mg/dL — ABNORMAL LOW (ref 8.9–10.3)
Chloride: 103 mmol/L (ref 98–111)
Creatinine, Ser: 0.93 mg/dL (ref 0.61–1.24)
GFR, Estimated: >60 mL/min (ref >60)
Glucose, Bld: 130 mg/dL — ABNORMAL HIGH (ref 70–99)
Potassium: 3.7 mmol/L (ref 3.5–5.1)
Sodium: 132 mmol/L — ABNORMAL LOW (ref 135–145)

## 2021-06-18 LAB — HEMOGLOBIN A1C
Hgb A1c MFr Bld: 5.7 % — ABNORMAL HIGH (ref 4.8–5.6)
Mean Plasma Glucose: 116.89 mg/dL

## 2021-06-18 LAB — HIV ANTIBODY (ROUTINE TESTING W REFLEX): HIV Screen 4th Generation wRfx: NONREACTIVE

## 2021-06-18 MED ORDER — SODIUM CHLORIDE 0.9 % IV SOLN
2.0000 g | Freq: Every day | INTRAVENOUS | Status: DC
  Administered 2021-06-18 – 2021-06-21 (×4): 2 g via INTRAVENOUS
  Filled 2021-06-18 (×5): qty 20

## 2021-06-18 NOTE — Progress Notes (Addendum)
PROGRESS NOTE  Reginald Rich [id number] DOB: 09/25/1965 DOA: 06/17/2021 PCP: Patient, No Pcp Per (Inactive)   LOS: 0 days   Brief narrative:  Reginald Rich is a 55 y.o. male with past medical history of hypertension, paroxysmal atrial fibrillation, Raynaud's phenomena associated with beta-blocker usage presented to the hospital with complaints of fever and facial swelling with puffiness and swelling of the eyes.  Patient was alternating ibuprofen and Tylenol at home for this problem.  Patient stated that it started with a pimple on his nasal area, not sure which side. In the ED, patient had blood cultures drawn.  COVID and influenza was negative.  Sodium was low at 132.  Creatinine 1.1.  Lactate was 1.3.  Patient had mild leukocytosis with WBC at 13.2 with absolute neutrophil at 12.3.  CT of this maxillofacial area showed soft tissue swelling and skin thickening in the face and periorbital soft tissues right worse than the left likely reflecting cellulitis.  There was no evidence of post septal extension or abscess.  Patient received IV Rocephin in the ED.  Patient was then considered for admission to the hospital    Assessment/Plan:  Principal Problem:   Facial cellulitis Active Problems:   Essential hypertension   A-fib (HCC)   Facial cellulitis.  Possibility of erysipelas.  Started with a pimple over the nose.  Widespread at this time, also periorbital puffiness, bilateral edema erythema with yellowish pustular looking skin area..  CT maxillofacial area however did not show any evidence of abscess.  Due to extensive nature of cellulitis and potential for rapid progression, will continue with Rocephin for now.  Received 1 dose of IV vancomycin yesterday.  Closely monitor.  Check MRSA swab, hemoglobin A1c, HIV. Continue IV Solu-Medrol due to extensive swelling around the periorbital region..  Temperature max of 100.9 F yesterday evening..  Blood cultures pending.  Leukocytosis  mild but slightly high today due to steroids.  Communicated with infectious disease, Dr. De Burrs.  We will continue with Rocephin for now due to high possibility of erysipelas..   Essential hypertension.  Not on medication.  Usually [id number] systolic at home as per the patient.  I have started on low-dose amlodipine.  On normal saline.  Will discontinue for now.  Atrial fibrillation. History of ablation for atrial fibrillation in the past, not on anticoagulation or any medication.  Raynaud's phenomena on beta-blocker.  Normal sinus rhythm at this time.  Mildly elevated LFTs.  Could be secondary to fatty liver disease/current infection.  Will need outpatient monitoring.    DVT prophylaxis: enoxaparin (LOVENOX) injection 40 mg Start: 06/17/21 2200   Code Status: Full code  Family Communication: Spoke with the patient's family at bedside on 06/17/2021  Status is: Observation  The patient will require care spanning > 2 midnights and should be moved to inpatient because: Need for IV antibiotics, gross cellulitis, follow blood cultures   Consultants: Infectious disease  Procedures: None  Anti-infectives:  Rocephin IV.  Anti-infectives (From admission, onward)    Start     Dose/Rate Route Frequency Ordered Stop   06/18/21 0800  vancomycin (VANCOCIN) IVPB 1000 mg/200 mL premix        1,000 mg 200 mL/hr over 60 Minutes Intravenous Every 12 hours 06/17/21 1727     06/17/21 1745  vancomycin (VANCOCIN) IVPB 1000 mg/200 mL premix  Status:  Discontinued        1,000 mg 200 mL/hr over 60 Minutes Intravenous  Once 06/17/21 1650 06/17/21 1655   06/17/21  1745  vancomycin (VANCOREADY) IVPB 1750 mg/350 mL        1,750 mg 175 mL/hr over 120 Minutes Intravenous  Once 06/17/21 1655 06/17/21 2006   06/17/21 1600  cefTRIAXone (ROCEPHIN) 1 g in sodium chloride 0.9 % 100 mL IVPB  Status:  Discontinued        1 g 200 mL/hr over 30 Minutes Intravenous  Once 06/17/21 1552 06/17/21 1553   06/17/21 1600   cefTRIAXone (ROCEPHIN) 2 g in sodium chloride 0.9 % 100 mL IVPB        2 g 200 mL/hr over 30 Minutes Intravenous  Once 06/17/21 1555 06/17/21 1630   06/17/21 0000  cefTRIAXone (ROCEPHIN) 1 g injection  Status:  Discontinued        2 g Intramuscular  Once 06/17/21 0912 06/17/21        Subjective: Today, patient was seen and examined at bedside.  Patient states that he does have periorbital of puffiness but no fever.  Feels little better but not significantly  Objective: Vitals:   06/18/21 0112 06/18/21 0515  BP: (!) 147/93 (!) 151/84  Pulse: (!) 101 100  Resp: 19 19  Temp: 98.9 F (37.2 C) 98.4 F (36.9 C)  SpO2: 96% 96%    Intake/Output Summary (Last 24 hours) at 06/18/2021 0845 Last data filed at 06/17/2021 1600 Gross per 24 hour  Intake 1000.5 ml  Output 450 ml  Net 550.5 ml   Filed Weights   06/17/21 0841 06/17/21 1900  Weight: 90.7 kg 90.7 kg   Body mass index is 26.38 kg/m.   Physical Exam: GENERAL: Patient is alert awake and oriented. Not in obvious distress. HENT: No scleral pallor or icterus. Pupils equally reactive to light. Oral mucosa is moist, bilateral periorbital puffiness, skin erythema edema, superficial yellowish exudate.  Induration over the cheek and forehead has decreased today. NECK: is supple, no gross swelling noted. CHEST: Clear to auscultation. No crackles or wheezes.  Diminished breath sounds bilaterally. CVS: S1 and S2 heard, no murmur. Regular rate and rhythm.  ABDOMEN: Soft, non-tender, bowel sounds are present. EXTREMITIES: No edema. CNS: Cranial nerves are intact. No focal motor deficits. SKIN: warm and dry , see rash on the face below on presentation    Data Review: I have personally reviewed the following laboratory data and studies,  CBC: Recent Labs  Lab 06/17/21 0856 06/18/21 0628  WBC 13.2* 15.4*  NEUTROABS 12.3*  --   HGB 17.0 14.4  HCT 49.5 42.0  MCV 85.6 86.4  PLT 194 [id number]   Basic Metabolic Panel: Recent Labs   Lab 06/17/21 0856 06/18/21 0628  NA 132* 132*  K 3.8 3.7  CL 97* 103  CO2 25 20*  GLUCOSE 185* 130*  BUN 15 12  CREATININE 1.15 0.93  CALCIUM 10.3 8.4*  MG  --  1.7   Liver Function Tests: Recent Labs  Lab 06/17/21 0856  AST 87*  ALT 81*  ALKPHOS 97  BILITOT 1.2  PROT 8.4*  ALBUMIN 4.4   No results for input(s): LIPASE, AMYLASE in the last 168 hours. No results for input(s): AMMONIA in the last 168 hours. Cardiac Enzymes: No results for input(s): CKTOTAL, CKMB, CKMBINDEX, TROPONINI in the last 168 hours. BNP (last 3 results) No results for input(s): BNP in the last 8760 hours.  ProBNP (last 3 results) No results for input(s): PROBNP in the last 8760 hours.  CBG: No results for input(s): GLUCAP in the last 168 hours. Recent Results (from the past 240  hour(s))  Resp Panel by RT-PCR (Flu A&B, Covid) Nasopharyngeal Swab     Status: None   Collection Time: 06/17/21  9:04 AM   Specimen: Nasopharyngeal Swab; Nasopharyngeal(NP) swabs in vial transport medium  Result Value Ref Range Status   SARS Coronavirus 2 by RT PCR NEGATIVE NEGATIVE Final    Comment: (NOTE) SARS-CoV-2 target nucleic acids are NOT DETECTED.  The SARS-CoV-2 RNA is generally detectable in upper respiratory specimens during the acute phase of infection. The lowest concentration of SARS-CoV-2 viral copies this assay can detect is 138 copies/mL. A negative result does not preclude SARS-Cov-2 infection and should not be used as the sole basis for treatment or other patient management decisions. A negative result may occur with  improper specimen collection/handling, submission of specimen other than nasopharyngeal swab, presence of viral mutation(s) within the areas targeted by this assay, and inadequate number of viral copies(<138 copies/mL). A negative result must be combined with clinical observations, patient history, and epidemiological information. The expected result is Negative.  Fact Sheet  for Patients:  BloggerCourse.com  Fact Sheet for Healthcare Providers:  SeriousBroker.it  This test is no t yet approved or cleared by the [place] FDA and  has been authorized for detection and/or diagnosis of SARS-CoV-2 by FDA under an Emergency Use Authorization (EUA). This EUA will remain  in effect (meaning this test can be used) for the duration of the COVID-19 declaration under Section 564(b)(1) of the Act, 21 U.S.C.section 360bbb-3(b)(1), unless the authorization is terminated  or revoked sooner.       Influenza A by PCR NEGATIVE NEGATIVE Final   Influenza B by PCR NEGATIVE NEGATIVE Final    Comment: (NOTE) The Xpert Xpress SARS-CoV-2/FLU/RSV plus assay is intended as an aid in the diagnosis of influenza from Nasopharyngeal swab specimens and should not be used as a sole basis for treatment. Nasal washings and aspirates are unacceptable for Xpert Xpress SARS-CoV-2/FLU/RSV testing.  Fact Sheet for Patients: BloggerCourse.com  Fact Sheet for Healthcare Providers: SeriousBroker.it  This test is not yet approved or cleared by the [place] FDA and has been authorized for detection and/or diagnosis of SARS-CoV-2 by FDA under an Emergency Use Authorization (EUA). This EUA will remain in effect (meaning this test can be used) for the duration of the COVID-19 declaration under Section 564(b)(1) of the Act, 21 U.S.C. section 360bbb-3(b)(1), unless the authorization is terminated or revoked.  Performed at [facility], [address], [place], [place] [address]      Studies: CT Maxillofacial W Contrast  Result Date: 06/17/2021 CLINICAL DATA:  Periorbital edema extending down to the mandible, fever EXAM: CT MAXILLOFACIAL WITH CONTRAST TECHNIQUE: Multidetector CT imaging of the maxillofacial structures was performed with intravenous contrast.  Multiplanar CT image reconstructions were also generated. CONTRAST:  27mL OMNIPAQUE IOHEXOL 300 MG/ML  SOLN COMPARISON:  None. FINDINGS: Osseous: There is no acute fracture. There is no suspicious osseous lesion. Mandibular alignment is normal. Orbits: Globes are intact. The extraocular muscles are normal. The orbital fat is preserved, without evidence of postseptal cellulitis. Sinuses: There is mild mucosal thickening in the paranasal sinuses with a prominent left maxillary sinus mucous retention cyst. Soft tissues: There is skin thickening and associated subcutaneous fat stranding in the face bilaterally, right worse than left, extending to the periorbital soft tissues and forehead. No organized fluid collection is seen. Bilateral palatine tonsilliths are noted, right more than left. The imaged salivary glands are normal. There is a linear metallic density foreign body measuring  6 mm in the soft tissues lateral to the left mandibular body of uncertain origin. Limited intracranial: The imaged portions of the intracranial compartment are unremarkable. IMPRESSION: 1. Soft tissue swelling and skin thickening in the face and periorbital soft tissues, right worse than left, likely reflecting cellulitis. There is no evidence of postseptal extension or abscess formation. 2. Linear metallic density foreign bodies in the soft tissues lateral to the left mandibular body of uncertain origin. Correlate with any surgical history and physical exam. Electronically Signed   By: Valetta Mole M.D.   On: 06/17/2021 11:03      Flora Lipps, MD  [facility] 06/18/2021  If 7PM-7AM, please contact night-coverage

## 2021-06-19 LAB — CBC
HCT: 43.8 % (ref 39.0–52.0)
Hemoglobin: 15.3 g/dL (ref 13.0–17.0)
MCH: 29.9 pg (ref 26.0–34.0)
MCHC: 34.9 g/dL (ref 30.0–36.0)
MCV: 85.7 fL (ref 80.0–100.0)
Platelets: 264 10*3/uL (ref 150–400)
RBC: 5.11 MIL/uL (ref 4.22–5.81)
RDW: 12.5 % (ref 11.5–15.5)
WBC: 15.9 10*3/uL — ABNORMAL HIGH (ref 4.0–10.5)
nRBC: 0 % (ref 0.0–0.2)

## 2021-06-19 LAB — BASIC METABOLIC PANEL
Anion gap: 8 (ref 5–15)
BUN: 17 mg/dL (ref 6–20)
CO2: 24 mmol/L (ref 22–32)
Calcium: 9.2 mg/dL (ref 8.9–10.3)
Chloride: 105 mmol/L (ref 98–111)
Creatinine, Ser: 0.89 mg/dL (ref 0.61–1.24)
GFR, Estimated: >60 mL/min (ref >60)
Glucose, Bld: 166 mg/dL — ABNORMAL HIGH (ref 70–99)
Potassium: 3.9 mmol/L (ref 3.5–5.1)
Sodium: 137 mmol/L (ref 135–145)

## 2021-06-19 MED ORDER — AMLODIPINE BESYLATE 10 MG PO TABS
10.0000 mg | ORAL_TABLET | Freq: Every day | ORAL | Status: DC
  Administered 2021-06-19: 11:00:00 10 mg via ORAL
  Filled 2021-06-19: qty 1

## 2021-06-19 MED ORDER — DILTIAZEM HCL 25 MG/5ML IV SOLN
20.0000 mg | Freq: Once | INTRAVENOUS | Status: AC
  Administered 2021-06-19: 19:00:00 20 mg via INTRAVENOUS
  Filled 2021-06-19: qty 5

## 2021-06-19 MED ORDER — DILTIAZEM LOAD VIA INFUSION: 20.0000 mg | Freq: Once | INTRAVENOUS | Status: DC

## 2021-06-19 MED ORDER — DILTIAZEM HCL 25 MG/5ML IV SOLN
20.0000 mg | Freq: Once | INTRAVENOUS | Status: AC
  Administered 2021-06-20: 01:00:00 20 mg via INTRAVENOUS
  Filled 2021-06-19: qty 5

## 2021-06-19 MED ORDER — DIGOXIN 0.25 MG/ML IJ SOLN
0.2500 mg | Freq: Once | INTRAMUSCULAR | Status: AC
  Administered 2021-06-19: 21:00:00 0.25 mg via INTRAVENOUS
  Filled 2021-06-19: qty 1

## 2021-06-19 NOTE — Progress Notes (Signed)
After dose of IV Solu-Medrol pt runs a high HR of 130s to 150s. He did this 06/18/21 dose.  Pt stating he feels "so jittery for about 30 minutes after dose". Have alerted MD via secure chat. Close monitoring continues. Getting EKG.

## 2021-06-19 NOTE — Progress Notes (Signed)
PROGRESS NOTE  Reginald Rich [id number] DOB: 1965/09/17 DOA: 06/17/2021 PCP: Patient, No Pcp Per (Inactive)   LOS: 1 day   Brief narrative:  Reginald Rich is a 55 y.o. male with past medical history of hypertension, paroxysmal atrial fibrillation, Raynaud's phenomena associated with beta-blocker usage presented to the hospital with complaints of fever and facial swelling with puffiness and swelling of the eyes.  Patient was alternating ibuprofen and Tylenol at home for this problem.  Patient stated that it started with a pimple on his nasal area, not sure which side. In the ED, patient had blood cultures drawn.  COVID and influenza was negative.  Sodium was low at 132.  Creatinine 1.1.  Lactate was 1.3.  Patient had mild leukocytosis with WBC at 13.2 with absolute neutrophil at 12.3.  CT of this maxillofacial area showed soft tissue swelling and skin thickening in the face and periorbital soft tissues right worse than the left likely reflecting cellulitis.  There was no evidence of post septal extension or abscess.  Patient received IV Rocephin in the ED.  Patient was then considered for admission to the hospital    Assessment/Plan:  Principal Problem:   Facial cellulitis Active Problems:   Essential hypertension   A-fib (HCC)   Cellulitis   Facial cellulitis likely erysipelas.  Started with a pimple over the nose.  Widespread on presentation with periorbital puffiness, bilateral edema erythema with yellowish pustular looking skin area..  CT maxillofacial area however did not show any evidence of abscess. hemoglobin A1c of 5.7, HIV was nonreactive.. Continue IV Solu-Medrol due to extensive swelling around the periorbital region.  Continue 40 twice daily for now.  Temperature max of 99.2 F.  Blood cultures no growth in 1 day.  Leukocytosis mildly but on steroids.  Communicated with infectious disease, Dr. Linus Salmons. We will continue with Rocephin    Essential hypertension.  Not on  medication at home..  Usually [id number] systolic at home as per the patient.  Have started on amlodipine 5mg .  Will increase to amlodipine 10  Atrial fibrillation. History of ablation for atrial fibrillation in the past, not on anticoagulation or any medication.  Raynaud's phenomena on beta-blocker.  Normal sinus rhythm at this time.  Mildly elevated LFTs.  Could be secondary to fatty liver disease/current infection.  Will need outpatient monitoring.  Check CMP in AM.   Disposition likely home tomorrow if he continues to improve  DVT prophylaxis: enoxaparin (LOVENOX) injection 40 mg Start: 06/17/21 2200   Code Status: Full code  Family Communication:  Spoke with the patient's family at bedside on 06/17/2021  Status is: Inpatient  The patient is inpatient because: Need for IV antibiotics, gross cellulitis, follow blood cultures   Consultants: Infectious disease-verbal consult  Procedures: None  Anti-infectives:  Rocephin IV 12/16>.  Anti-infectives (From admission, onward)    Start     Dose/Rate Route Frequency Ordered Stop   06/18/21 1200  cefTRIAXone (ROCEPHIN) 2 g in sodium chloride 0.9 % 100 mL IVPB        2 g 200 mL/hr over 30 Minutes Intravenous Daily 06/18/21 1106 06/23/21 0959   06/18/21 0800  vancomycin (VANCOCIN) IVPB 1000 mg/200 mL premix  Status:  Discontinued        1,000 mg 200 mL/hr over 60 Minutes Intravenous Every 12 hours 06/17/21 1727 06/18/21 1106   06/17/21 1745  vancomycin (VANCOCIN) IVPB 1000 mg/200 mL premix  Status:  Discontinued        1,000 mg 200 mL/hr  over 60 Minutes Intravenous  Once 06/17/21 1650 06/17/21 1655   06/17/21 1745  vancomycin (VANCOREADY) IVPB 1750 mg/350 mL        1,750 mg 175 mL/hr over 120 Minutes Intravenous  Once 06/17/21 1655 06/17/21 2006   06/17/21 1600  cefTRIAXone (ROCEPHIN) 1 g in sodium chloride 0.9 % 100 mL IVPB  Status:  Discontinued        1 g 200 mL/hr over 30 Minutes Intravenous  Once 06/17/21 1552 06/17/21 1553    06/17/21 1600  cefTRIAXone (ROCEPHIN) 2 g in sodium chloride 0.9 % 100 mL IVPB        2 g 200 mL/hr over 30 Minutes Intravenous  Once 06/17/21 1555 06/17/21 1630   06/17/21 0000  cefTRIAXone (ROCEPHIN) 1 g injection  Status:  Discontinued        2 g Intramuscular  Once 06/17/21 0912 06/17/21        Subjective:  Today, patient was seen and examined at bedside.  Patient feels much better than yesterday.  Less redness swelling and induration.  Has had less fever.  The orbital puffiness still persist  Objective: Vitals:   06/18/21 2012 06/19/21 0501  BP: 137/86 (!) 147/95  Pulse: 91 84  Resp: 18 18  Temp: 99.2 F (37.3 C) 97.9 F (36.6 C)  SpO2: 97% 98%    Intake/Output Summary (Last 24 hours) at 06/19/2021 0855 Last data filed at 06/18/2021 1732 Gross per 24 hour  Intake 720 ml  Output --  Net 720 ml    Filed Weights   06/17/21 0841 06/17/21 1900  Weight: 90.7 kg 90.7 kg   Body mass index is 26.38 kg/m.   Physical Exam: GENERAL: Patient is alert awake and oriented. Not in obvious distress. HENT: No scleral pallor or icterus. Pupils equally reactive to light. Oral mucosa is moist bilateral periorbital puffiness still persist.  Skin erythema induration decreased  NECK: is supple, no gross swelling noted. CHEST: Clear to auscultation. No crackles or wheezes.  Diminished breath sounds bilaterally. CVS: S1 and S2 heard, no murmur. Regular rate and rhythm.  ABDOMEN: Soft, non-tender, bowel sounds are present. EXTREMITIES: No edema. CNS: Cranial nerves are intact. No focal motor deficits. SKIN: warm and dry , see rash on the face below on presentation,    Data Review: I have personally reviewed the following laboratory data and studies,  CBC: Recent Labs  Lab 06/17/21 0856 06/18/21 0628  WBC 13.2* 15.4*  NEUTROABS 12.3*  --   HGB 17.0 14.4  HCT 49.5 42.0  MCV 85.6 86.4  PLT 194 [id number]    Basic Metabolic Panel: Recent Labs  Lab 06/17/21 0856 06/18/21 0628   NA 132* 132*  K 3.8 3.7  CL 97* 103  CO2 25 20*  GLUCOSE 185* 130*  BUN 15 12  CREATININE 1.15 0.93  CALCIUM 10.3 8.4*  MG  --  1.7    Liver Function Tests: Recent Labs  Lab 06/17/21 0856  AST 87*  ALT 81*  ALKPHOS 97  BILITOT 1.2  PROT 8.4*  ALBUMIN 4.4    No results for input(s): LIPASE, AMYLASE in the last 168 hours. No results for input(s): AMMONIA in the last 168 hours. Cardiac Enzymes: No results for input(s): CKTOTAL, CKMB, CKMBINDEX, TROPONINI in the last 168 hours. BNP (last 3 results) No results for input(s): BNP in the last 8760 hours.  ProBNP (last 3 results) No results for input(s): PROBNP in the last 8760 hours.  CBG: No results for input(s): GLUCAP  in the last 168 hours. Recent Results (from the past 240 hour(s))  Blood culture (routine x 2)     Status: None (Preliminary result)   Collection Time: 06/17/21  8:55 AM   Specimen: Left Antecubital; Blood  Result Value Ref Range Status   Specimen Description   Final    LEFT ANTECUBITAL Performed at [facility], [address] [place], [place] [address]    Special Requests   Final    BOTTLES DRAWN AEROBIC AND ANAEROBIC Blood Culture adequate volume Performed at [facility], [address] [place], [place] [address]    Culture   Final    NO GROWTH 1 DAY Performed at [facility], [place] [address], [place], [place] [address]    Report Status PENDING  Incomplete  Blood culture (routine x 2)     Status: None (Preliminary result)   Collection Time: 06/17/21  8:59 AM   Specimen: BLOOD LEFT HAND  Result Value Ref Range Status   Specimen Description   Final    BLOOD LEFT HAND Performed at [facility], [address], [place], [place] [address]    Special Requests   Final    BOTTLES DRAWN AEROBIC ONLY Blood Culture adequate volume Performed at [facility], [address] [place], [place] [address]    Culture    Final    NO GROWTH 1 DAY Performed at [facility], [place] [address], [place], [place] [address]    Report Status PENDING  Incomplete  Resp Panel by RT-PCR (Flu A&B, Covid) Nasopharyngeal Swab     Status: None   Collection Time: 06/17/21  9:04 AM   Specimen: Nasopharyngeal Swab; Nasopharyngeal(NP) swabs in vial transport medium  Result Value Ref Range Status   SARS Coronavirus 2 by RT PCR NEGATIVE NEGATIVE Final    Comment: (NOTE) SARS-CoV-2 target nucleic acids are NOT DETECTED.  The SARS-CoV-2 RNA is generally detectable in upper respiratory specimens during the acute phase of infection. The lowest concentration of SARS-CoV-2 viral copies this assay can detect is 138 copies/mL. A negative result does not preclude SARS-Cov-2 infection and should not be used as the sole basis for treatment or other patient management decisions. A negative result may occur with  improper specimen collection/handling, submission of specimen other than nasopharyngeal swab, presence of viral mutation(s) within the areas targeted by this assay, and inadequate number of viral copies(<138 copies/mL). A negative result must be combined with clinical observations, patient history, and epidemiological information. The expected result is Negative.  Fact Sheet for Patients:  EntrepreneurPulse.com.au  Fact Sheet for Healthcare Providers:  IncredibleEmployment.be  This test is no t yet approved or cleared by the [place] FDA and  has been authorized for detection and/or diagnosis of SARS-CoV-2 by FDA under an Emergency Use Authorization (EUA). This EUA will remain  in effect (meaning this test can be used) for the duration of the COVID-19 declaration under Section 564(b)(1) of the Act, 21 U.S.C.section 360bbb-3(b)(1), unless the authorization is terminated  or revoked sooner.       Influenza A by PCR NEGATIVE NEGATIVE Final   Influenza B by PCR NEGATIVE  NEGATIVE Final    Comment: (NOTE) The Xpert Xpress SARS-CoV-2/FLU/RSV plus assay is intended as an aid in the diagnosis of influenza from Nasopharyngeal swab specimens and should not be used as a sole basis for treatment. Nasal washings and aspirates are unacceptable for Xpert Xpress SARS-CoV-2/FLU/RSV testing.  Fact Sheet for Patients: EntrepreneurPulse.com.au  Fact Sheet for Healthcare Providers: SeriousBroker.it  This test is not yet approved or cleared by the [place] FDA and has been authorized for detection and/or diagnosis of SARS-CoV-2 by FDA under an Emergency Use Authorization (EUA). This EUA will remain in effect (meaning this test can be used) for the duration of the COVID-19 declaration under Section 564(b)(1) of the Act, 21 U.S.C. section 360bbb-3(b)(1), unless the authorization is terminated or revoked.  Performed at [facility], [address] [place], [place] [address]       Studies: CT Maxillofacial W Contrast  Result Date: 06/17/2021 CLINICAL DATA:  Periorbital edema extending down to the mandible, fever EXAM: CT MAXILLOFACIAL WITH CONTRAST TECHNIQUE: Multidetector CT imaging of the maxillofacial structures was performed with intravenous contrast. Multiplanar CT image reconstructions were also generated. CONTRAST:  12mL OMNIPAQUE IOHEXOL 300 MG/ML  SOLN COMPARISON:  None. FINDINGS: Osseous: There is no acute fracture. There is no suspicious osseous lesion. Mandibular alignment is normal. Orbits: Globes are intact. The extraocular muscles are normal. The orbital fat is preserved, without evidence of postseptal cellulitis. Sinuses: There is mild mucosal thickening in the paranasal sinuses with a prominent left maxillary sinus mucous retention cyst. Soft tissues: There is skin thickening and associated subcutaneous fat stranding in the face bilaterally, right worse than left, extending to the  periorbital soft tissues and forehead. No organized fluid collection is seen. Bilateral palatine tonsilliths are noted, right more than left. The imaged salivary glands are normal. There is a linear metallic density foreign body measuring 6 mm in the soft tissues lateral to the left mandibular body of uncertain origin. Limited intracranial: The imaged portions of the intracranial compartment are unremarkable. IMPRESSION: 1. Soft tissue swelling and skin thickening in the face and periorbital soft tissues, right worse than left, likely reflecting cellulitis. There is no evidence of postseptal extension or abscess formation. 2. Linear metallic density foreign bodies in the soft tissues lateral to the left mandibular body of uncertain origin. Correlate with any surgical history and physical exam. Electronically Signed   By: Lesia Hausen M.D.   On: 06/17/2021 11:03      Joycelyn Das, MD  [facility] 06/19/2021  If 7PM-7AM, please contact night-coverage

## 2021-06-19 NOTE — Progress Notes (Signed)
HR now 130-140s. Pt still lying in bed watching TV. States when he goes into AFib at home it lasts about an hour.  Report given to receiving nurse tonight. Close monitoring to continue.

## 2021-06-19 NOTE — Progress Notes (Signed)
20MG  IV Cardizem given per order. Monitoring.

## 2021-06-19 NOTE — Progress Notes (Signed)
EKG shows A.Fib. RVR. MD aware. Awaiting new orders.

## 2021-06-20 ENCOUNTER — Inpatient Hospital Stay (HOSPITAL_COMMUNITY): Payer: 59

## 2021-06-20 DIAGNOSIS — A46 Erysipelas: Secondary | ICD-10-CM

## 2021-06-20 DIAGNOSIS — I480 Paroxysmal atrial fibrillation: Secondary | ICD-10-CM

## 2021-06-20 DIAGNOSIS — I4891 Unspecified atrial fibrillation: Secondary | ICD-10-CM

## 2021-06-20 LAB — BASIC METABOLIC PANEL
Anion gap: 8 (ref 5–15)
BUN: 17 mg/dL (ref 6–20)
CO2: 23 mmol/L (ref 22–32)
Calcium: 8.9 mg/dL (ref 8.9–10.3)
Chloride: 108 mmol/L (ref 98–111)
Creatinine, Ser: 0.8 mg/dL (ref 0.61–1.24)
GFR, Estimated: >60 mL/min (ref >60)
Glucose, Bld: 130 mg/dL — ABNORMAL HIGH (ref 70–99)
Potassium: 3.8 mmol/L (ref 3.5–5.1)
Sodium: 139 mmol/L (ref 135–145)

## 2021-06-20 LAB — ECHOCARDIOGRAM COMPLETE
Height: 73 in
S' Lateral: 3.1 cm
Weight: 3301.61 oz

## 2021-06-20 LAB — MRSA NEXT GEN BY PCR, NASAL: MRSA by PCR Next Gen: NOT DETECTED

## 2021-06-20 LAB — MAGNESIUM: Magnesium: 2.1 mg/dL (ref 1.7–2.4)

## 2021-06-20 LAB — CBC
HCT: 46.8 % (ref 39.0–52.0)
Hemoglobin: 16.2 g/dL (ref 13.0–17.0)
MCH: 29.7 pg (ref 26.0–34.0)
MCHC: 34.6 g/dL (ref 30.0–36.0)
MCV: 85.9 fL (ref 80.0–100.0)
Platelets: 321 10*3/uL (ref 150–400)
RBC: 5.45 MIL/uL (ref 4.22–5.81)
RDW: 12.7 % (ref 11.5–15.5)
WBC: 16.5 10*3/uL — ABNORMAL HIGH (ref 4.0–10.5)
nRBC: 0 % (ref 0.0–0.2)

## 2021-06-20 LAB — TSH: TSH: 2.268 u[IU]/mL (ref 0.350–4.500)

## 2021-06-20 MED ORDER — CHLORHEXIDINE GLUCONATE CLOTH 2 % EX PADS
6.0000 | MEDICATED_PAD | Freq: Every day | CUTANEOUS | Status: DC
  Administered 2021-06-20: 20:00:00 6 via TOPICAL

## 2021-06-20 MED ORDER — METHYLPREDNISOLONE SODIUM SUCC 40 MG IJ SOLR: 40.0000 mg | Freq: Two times a day (BID) | INTRAMUSCULAR | Status: DC

## 2021-06-20 MED ORDER — LABETALOL HCL 5 MG/ML IV SOLN
20.0000 mg | Freq: Once | INTRAVENOUS | Status: AC
  Administered 2021-06-20: 18:00:00 20 mg via INTRAVENOUS
  Filled 2021-06-20: qty 4

## 2021-06-20 MED ORDER — DILTIAZEM HCL-DEXTROSE 125-5 MG/125ML-% IV SOLN (PREMIX)
5.0000 mg/h | INTRAVENOUS | Status: DC
  Administered 2021-06-20: 16:00:00 15 mg/h via INTRAVENOUS
  Administered 2021-06-20: 09:00:00 5 mg/h via INTRAVENOUS
  Filled 2021-06-20 (×2): qty 125

## 2021-06-20 MED ORDER — DILTIAZEM HCL 25 MG/5ML IV SOLN
15.0000 mg | Freq: Once | INTRAVENOUS | Status: AC
  Administered 2021-06-20: 06:00:00 15 mg via INTRAVENOUS
  Filled 2021-06-20: qty 5

## 2021-06-20 MED ORDER — DILTIAZEM LOAD VIA INFUSION
15.0000 mg | Freq: Once | INTRAVENOUS | Status: AC
  Administered 2021-06-20: 09:00:00 15 mg via INTRAVENOUS
  Filled 2021-06-20: qty 15

## 2021-06-20 MED ORDER — APIXABAN 5 MG PO TABS
5.0000 mg | ORAL_TABLET | Freq: Two times a day (BID) | ORAL | Status: DC
  Administered 2021-06-20 – 2021-06-21 (×3): 5 mg via ORAL
  Filled 2021-06-20 (×3): qty 1

## 2021-06-20 NOTE — Progress Notes (Signed)
°  Echocardiogram 2D Echocardiogram has been performed.  Reginald Rich 06/20/2021, 2:45 PM

## 2021-06-20 NOTE — Progress Notes (Signed)
Patient's HR sustaining 130s-140s. On-call provider ware. 20 mg IV Cardizem ordered. Will continue to monitor the patient.

## 2021-06-20 NOTE — Progress Notes (Signed)
PROGRESS NOTE  [name] [id number] DOB: September 09, 1965 DOA: 06/17/2021 PCP: Patient, No Pcp Per (Inactive)   LOS: 2 days   Brief narrative:  Reginald Rich is a 55 y.o. male with past medical history of hypertension, paroxysmal atrial fibrillation, Raynaud's phenomena associated with beta-blocker usage presented to the hospital with complaints of fever and facial swelling with puffiness and swelling of the eyes.  Patient was alternating ibuprofen and Tylenol at home for this problem.  Patient stated that it started with a pimple on his nasal area, not sure which side. In the ED, patient had blood cultures drawn.  COVID and influenza was negative.  Sodium was low at 132.  Creatinine 1.1.  Lactate was 1.3.  Patient had mild leukocytosis with WBC at 13.2 with absolute neutrophil at 12.3.  CT of this maxillofacial area showed soft tissue swelling and skin thickening in the face and periorbital soft tissues right worse than the left likely reflecting cellulitis.  There was no evidence of post septal extension or abscess.  Patient received IV Rocephin in the ED.  Patient was then considered for admission to the hospital   Assessment/Plan:  Principal Problem:   Facial cellulitis Active Problems:   Essential hypertension   A-fib (HCC)   Cellulitis   Facial cellulitis likely erysipelas.  Started with a pimple over the nose.  Widespread on presentation with periorbital puffiness, bilateral edema erythema with yellowish pustular looking skin area..  CT maxillofacial area however did not show any evidence of abscess. hemoglobin A1c of 5.7, HIV was nonreactive..  Patient received IV Solu-Medrol with improvement in his swelling but due to the jitteriness has been discontinued at this time.  Temperature max of 98.2F.  Blood cultures no growth in 3 day.  Leukocytosis but was on steroids.  Communicated with infectious disease, Dr. Linus Salmons. We will continue with Rocephin, total course of antibiotic  would be 7 to 10 days  Paroxysmal atrial fibrillation with RVR. History of atrial fibrillation status post ablation and failure with antiarrhythmics in the past.  Cardiology has been consulted.  Current heart rates between 140-160s.  Blood pressure is still stable.  History of ablation for atrial fibrillation in the past, not on anticoagulation or any medication at home.  Patient was noted to have A. fib with RVR yesterday but has persisted this morning.  Has received Cardizem IV push x3 and digoxin as well.  We will start the patient on Cardizem drip.    Raynaud's phenomena on beta-blocker but the patient is completely not aware of this.  We will follow cardiology recommendations at this time.   Essential hypertension.  Not on medication at home..  Usually [id number] systolic at home as per the patient.  Patient was started on amlodipine due to new onset A. fib with RVR has been started on Cardizem drip.  We will discontinue amlodipine for now.  Mildly elevated LFTs.  On initial LFTs.  Could be secondary to fatty liver disease. Will need outpatient monitoring.  Check CMP in AM.  Disposition likely in 1 to 2 days if improved with A. fib with RVR.  DVT prophylaxis:  apixaban (ELIQUIS) tablet 5 mg   Code Status: Full code  Family Communication:  Spoke with the patient's family at bedside on 06/17/2021, spoke with the patient at bedside.  Status is: Inpatient  The patient is inpatient because: Need for IV antibiotics, new onset atrial fibrillation with RVR, cardiology consultation..  Consultants: Infectious disease-verbal consult Cardiology  Procedures: None  Anti-infectives:  Rocephin IV 12/16>.   Subjective:  Today, patient was seen and examined at bedside.  Patient overall feels better.  Denies any chest pain, shortness of breath, dizziness or lightheadedness.  Has been having persistently elevated heart rate.  Swelling and redness has improved.  Objective: Vitals:   06/20/21 0600  06/20/21 0800  BP: 138/77   Pulse: (!) 42   Resp: 16   Temp:  97.8 F (36.6 C)  SpO2: 94%     Intake/Output Summary (Last 24 hours) at 06/20/2021 1135 Last data filed at 06/20/2021 0500 Gross per 24 hour  Intake 320 ml  Output 200 ml  Net 120 ml    Filed Weights   06/17/21 1900 06/20/21 0408 06/20/21 0800  Weight: 90.7 kg 75.8 kg 93.6 kg   Body mass index is 27.22 kg/m.   Physical Exam:  General:  Average built, not in obvious distress HENT:   No scleral pallor or icterus noted. Oral mucosa is moist.  Decreased with periorbital puffiness, decreased induration and erythema over the face Chest:  Clear breath sounds.  Diminished breath sounds bilaterally. No crackles or wheezes.  CVS: S1 &S2 heard. No murmur.  Regular rate and rhythm. Abdomen: Soft, nontender, nondistended.  Bowel sounds are heard.   Extremities: No cyanosis, clubbing or edema.  Peripheral pulses are palpable. Psych: Alert, awake and oriented, normal mood CNS:  No cranial nerve deficits.  Power equal in all extremities.   Skin: Warm and dry.    See rash on the face below on presentation.     Data Review: I have personally reviewed the following laboratory data and studies,  CBC: Recent Labs  Lab 06/17/21 0856 06/18/21 0628 06/19/21 0836 06/20/21 0330  WBC 13.2* 15.4* 15.9* 16.5*  NEUTROABS 12.3*  --   --   --   HGB 17.0 14.4 15.3 16.2  HCT 49.5 42.0 43.8 46.8  MCV 85.6 86.4 85.7 85.9  PLT 194 187 264 321    Basic Metabolic Panel: Recent Labs  Lab 06/17/21 0856 06/18/21 0628 06/19/21 0836 06/20/21 0330  NA 132* 132* 137 139  K 3.8 3.7 3.9 3.8  CL 97* 103 105 108  CO2 25 20* 24 23  GLUCOSE 185* 130* 166* 130*  BUN 15 12 17 17   CREATININE 1.15 0.93 0.89 0.80  CALCIUM 10.3 8.4* 9.2 8.9  MG  --  1.7  --  2.1    Liver Function Tests: Recent Labs  Lab 06/17/21 0856  AST 87*  ALT 81*  ALKPHOS 97  BILITOT 1.2  PROT 8.4*  ALBUMIN 4.4    No results for input(s): LIPASE,  AMYLASE in the last 168 hours. No results for input(s): AMMONIA in the last 168 hours. Cardiac Enzymes: No results for input(s): CKTOTAL, CKMB, CKMBINDEX, TROPONINI in the last 168 hours. BNP (last 3 results) No results for input(s): BNP in the last 8760 hours.  ProBNP (last 3 results) No results for input(s): PROBNP in the last 8760 hours.  CBG: No results for input(s): GLUCAP in the last 168 hours. Recent Results (from the past 240 hour(s))  Blood culture (routine x 2)     Status: None (Preliminary result)   Collection Time: 06/17/21  8:55 AM   Specimen: Left Antecubital; Blood  Result Value Ref Range Status   Specimen Description   Final    LEFT ANTECUBITAL Performed at [facility], [address] [place], [place]    Special Requests   Final    BOTTLES DRAWN  AEROBIC AND ANAEROBIC Blood Culture adequate volume Performed at [facility], [address] [place], [place] [address]    Culture   Final    NO GROWTH 3 DAYS Performed at [facility], [place] [address], [place], [place] [address]    Report Status PENDING  Incomplete  Blood culture (routine x 2)     Status: None (Preliminary result)   Collection Time: 06/17/21  8:59 AM   Specimen: BLOOD LEFT HAND  Result Value Ref Range Status   Specimen Description   Final    BLOOD LEFT HAND Performed at [facility], [address] [place], [place] [address]    Special Requests   Final    BOTTLES DRAWN AEROBIC ONLY Blood Culture adequate volume Performed at [facility], [address], [place], [place] [address]    Culture   Final    NO GROWTH 3 DAYS Performed at [facility], [place] [address], [place], [place] [address]    Report Status PENDING  Incomplete  Resp Panel by RT-PCR (Flu A&B, Covid) Nasopharyngeal Swab     Status: None   Collection Time: 06/17/21  9:04 AM   Specimen: Nasopharyngeal Swab; Nasopharyngeal(NP)  swabs in vial transport medium  Result Value Ref Range Status   SARS Coronavirus 2 by RT PCR NEGATIVE NEGATIVE Final    Comment: (NOTE) SARS-CoV-2 target nucleic acids are NOT DETECTED.  The SARS-CoV-2 RNA is generally detectable in upper respiratory specimens during the acute phase of infection. The lowest concentration of SARS-CoV-2 viral copies this assay can detect is 138 copies/mL. A negative result does not preclude SARS-Cov-2 infection and should not be used as the sole basis for treatment or other patient management decisions. A negative result may occur with  improper specimen collection/handling, submission of specimen other than nasopharyngeal swab, presence of viral mutation(s) within the areas targeted by this assay, and inadequate number of viral copies(<138 copies/mL). A negative result must be combined with clinical observations, patient history, and epidemiological information. The expected result is Negative.  Fact Sheet for Patients:  EntrepreneurPulse.com.au  Fact Sheet for Healthcare Providers:  IncredibleEmployment.be  This test is no t yet approved or cleared by the [place] FDA and  has been authorized for detection and/or diagnosis of SARS-CoV-2 by FDA under an Emergency Use Authorization (EUA). This EUA will remain  in effect (meaning this test can be used) for the duration of the COVID-19 declaration under Section 564(b)(1) of the Act, 21 U.S.C.section 360bbb-3(b)(1), unless the authorization is terminated  or revoked sooner.       Influenza A by PCR NEGATIVE NEGATIVE Final   Influenza B by PCR NEGATIVE NEGATIVE Final    Comment: (NOTE) The Xpert Xpress SARS-CoV-2/FLU/RSV plus assay is intended as an aid in the diagnosis of influenza from Nasopharyngeal swab specimens and should not be used as a sole basis for treatment. Nasal washings and aspirates are unacceptable for Xpert Xpress  SARS-CoV-2/FLU/RSV testing.  Fact Sheet for Patients: EntrepreneurPulse.com.au  Fact Sheet for Healthcare Providers: IncredibleEmployment.be  This test is not yet approved or cleared by the [place] FDA and has been authorized for detection and/or diagnosis of SARS-CoV-2 by FDA under an Emergency Use Authorization (EUA). This EUA will remain in effect (meaning this test can be used) for the duration of the COVID-19 declaration under Section 564(b)(1) of the Act, 21 U.S.C. section 360bbb-3(b)(1), unless the authorization is terminated or revoked.  Performed at [facility], [place]  [place], [place], [place] [address]   MRSA Next Gen by PCR, Nasal     Status: None   Collection Time: 06/20/21  4:07 AM   Specimen: Nasal Mucosa; Nasal Swab  Result Value Ref Range Status   MRSA by PCR Next Gen NOT DETECTED NOT DETECTED Final    Comment: (NOTE) The GeneXpert MRSA Assay (FDA approved for NASAL specimens only), is one component of a comprehensive MRSA colonization surveillance program. It is not intended to diagnose MRSA infection nor to guide or monitor treatment for MRSA infections. Test performance is not FDA approved in patients less than 4 years old. Performed at [facility], [place] [address] [place], [place] [address]       Studies: No results found.    Flora Lipps, MD  [facility] 06/20/2021  If 7PM-7AM, please contact night-coverage

## 2021-06-20 NOTE — Progress Notes (Signed)
Received order to Transfer patient to 1238.

## 2021-06-20 NOTE — Progress Notes (Signed)
°  Transition of Care [facility]) Screening Note   Patient Details  Name: Reginald Rich Date of Birth: May 10, 1966   Transition of Care [facility]) CM/SW Contact:    Rielle Schlauch, Meriam Sprague, RN Phone Number: 06/20/2021, 12:47 PM    Transition of Care Department [facility]) has reviewed patient and no TOC needs have been identified at this time. We will continue to monitor patient advancement through interdisciplinary progression rounds. If new patient transition needs arise, please place a TOC consult.

## 2021-06-20 NOTE — Consult Note (Addendum)
Cardiology Consultation:   Patient ID: Reginald Rich MRN: [id number]; DOB: 09/14/1965  Admit date: 06/17/2021 Date of Consult: 06/20/2021  PCP:  Patient, No Pcp Per (Inactive)   [facility] Providers Cardiologist: None Electrophysiologist: Dr. Rayann Rich    Patient Profile:   Reginald Rich is a 55 y.o. male with a hx of paroxysmal atrial fibrillation s/p ablation x 2, hypertension and Raynaud's phenomena on beta-blocker who is being seen 06/20/2021 for the evaluation of atrial fibrillation at the request of Dr. Louanne Rich.  CT Coronary 06/2015: IMPRESSION: 1. The left atrial appendage is a mixed chicken wing and broccoli type with two main lobes. It is very large appendage with ostial size 28 x 18 mm and length 41 mm. There is no filling defect. 2. Pulmonary veins drain normally into the left atrium (two on the right and two on the left). 3. The esophagus run to the left from midline in the proximity to the common origin of the left pulmonary veins. 4. There is a persistent foramen ovale (PFO) but no ASD.  Patient with longstanding history of atrial fibrillation and underwent ablation by Dr. Adrian Rich in [facility] 2008 & Dr. Rayann Rich December 2016.  Per note his episode was mostly postprandial and resolved by next morning.  Failed flecainide therapy.  His anticoagulation discontinued after 3 months of ablation.  Last seen by Dr. Rayann Rich February 2017.   History of Present Illness:   Mr. Reginald Rich presented December 16 with periorbital swelling, erythema and yellowish pustular drainage.  No abscess on CT of maxillofacial area.  He was admitted for facial cellulitis, likely Erysipels.  Treated with Rocephin.  Patient had mildly elevated LFT felt secondary to fatty liver disease or current infection.  Patient went into atrial fibrillation with rapid ventricular rate after getting IV Solu-Medrol (getting twice daily dose since admission) yesterday evening.  He was given IV  Cardizem 20 mg x 2 and 15 mg x 1.  Also got IV digoxin 0.25mg  x 1 last night. Due to persistent elevated rate he was now given IV Cardizem load and on gtt 5 mg/hru >> increased to 10mg /hr. Stopped amlodipine.   Patient does report feeling palpitation but no associated chest pain, shortness of breath or dizziness.  He denies orthopnea, PND, syncope, lower extremity edema or melena.  Review of telemetry (personally) showed patient is in sinus rhythm since admission.  Had a brief episode of atrial fibrillation yesterday morning around 8 AM and then went into persistent atrial fibrillation since 1800 at rate of greater than 140 bpm.  Got IV Solu-Medrol yesterday at [id number] and [id number].   Patient reports he was doing relatively well since his ablation by Dr. Rayann Rich.  Only rare episode of atrial fibrillation lasting for less than 1 hour.  At baseline he is very active doing yard work and physical activity.  Hemoglobin A1c 5.7 Magnesium 2.1 Potassium 3.8 Creatinine normal Hemoglobin normal  Past Medical History:  Diagnosis Date   Hypertension    Kidney calculi    Paroxysmal atrial fibrillation (HCC)    Raynaud's phenomenon associated with beta blocker use     Past Surgical History:  Procedure Laterality Date   afib ablation  2008   [facility] by Dr Reginald Rich   ELECTROPHYSIOLOGIC STUDY N/A 06/10/2015   Atrial fibrillation ablation by Dr Reginald Rich     Inpatient Medications: Scheduled Meds:  Chlorhexidine Gluconate Cloth  6 each Topical Daily   docusate sodium  100 mg Oral BID   enoxaparin (LOVENOX) injection  40 mg Subcutaneous Q24H   pantoprazole  40 mg Oral Daily   traZODone  50 mg Oral QHS   Continuous Infusions:  cefTRIAXone (ROCEPHIN)  IV Stopped (06/19/21 1139)   diltiazem (CARDIZEM) infusion 5 mg/hr (06/20/21 0921)   PRN Meds: acetaminophen **OR** acetaminophen, HYDROcodone-acetaminophen, ibuprofen, ondansetron **OR** ondansetron (ZOFRAN) IV, polyethylene glycol,  zolpidem  Allergies:   No Known Allergies  Social History:   Social History   Socioeconomic History   Marital status: Divorced    Spouse name: Not on file   Number of children: Not on file   Years of education: Not on file   Highest education level: Not on file  Occupational History   Occupation: Lobbyist: [employer]  Tobacco Use   Smoking status: Never   Smokeless tobacco: Not on file  Substance and Sexual Activity   Alcohol use: Yes   Drug use: No   Sexual activity: Not on file  Other Topics Concern   Not on file  Social History Narrative   Works as Chief Financial Officer for [employer]   Married - 2 sons   Social Determinants of Radio broadcast assistant Strain: Not on file  Food Insecurity: Not on file  Transportation Needs: Not on file  Physical Activity: Not on file  Stress: Not on file  Social Connections: Not on file  Intimate Partner Violence: Not on file    Family History:   Family History  Problem Relation Age of Onset   Atrial fibrillation Mother 8   Hypertension Sister    Atrial fibrillation Maternal Grandfather      ROS:  Please see the history of present illness.  All other ROS reviewed and negative.     Physical Exam/Data:   Vitals:   06/20/21 0435 06/20/21 0500 06/20/21 0600 06/20/21 0800  BP: (!) 165/95 136/90 138/77   Pulse: (!) 38 68 (!) 42   Resp: 18 17 16    Temp:    97.8 F (36.6 C)  TempSrc:    Oral  SpO2: 97% 96% 94%   Weight:    93.6 kg  Height:        Intake/Output Summary (Last 24 hours) at 06/20/2021 0947 Last data filed at 06/20/2021 0500 Gross per 24 hour  Intake 320 ml  Output 200 ml  Net 120 ml   Last 3 Weights 06/20/2021 06/20/2021 06/17/2021  Weight (lbs) 206 lb 5.6 oz 167 lb 1.7 oz 199 lb 15.3 oz  Weight (kg) 93.6 kg 75.8 kg 90.7 kg     Body mass index is 27.22 kg/m.  General:  Well nourished, well developed, in no acute distress HEENT: Facial erythema and edema Neck: no JVD Vascular: No carotid bruits;  Distal pulses 2+ bilaterally Cardiac:  normal S1, S2; irregular tachycardic; no murmur  Lungs:  clear to auscultation bilaterally, no wheezing, rhonchi or rales  Abd: soft, nontender, no hepatomegaly  Ext: no edema Musculoskeletal:  No deformities, BUE and BLE strength normal and equal Skin: warm and dry  Neuro:  CNs 2-12 intact, no focal abnormalities noted Psych:  Normal affect   EKG:  The EKG was personally reviewed and demonstrates: Atrial fibrillation at rate of 141 bpm Telemetry:  Telemetry was personally reviewed and demonstrates: As discussed above  Relevant CV Studies:  Echo 08/2012 Study Conclusions   - Left ventricle: The cavity size was normal. Wall thickness    was normal. Systolic function was normal. The estimated    ejection fraction was in the range  of 55% to 60%. Wall    motion was normal; there were no regional wall motion    abnormalities. Left ventricular diastolic function    parameters were normal.  - Pulmonic valve: Peak gradient: 46mm Hg (S).   Laboratory Data:  High Sensitivity Troponin:  No results for input(s): TROPONINIHS in the last 720 hours.   Chemistry Recent Labs  Lab 06/18/21 0628 06/19/21 0836 06/20/21 0330  NA 132* 137 139  K 3.7 3.9 3.8  CL 103 105 108  CO2 20* 24 23  GLUCOSE 130* 166* 130*  BUN 12 17 17   CREATININE 0.93 0.89 0.80  CALCIUM 8.4* 9.2 8.9  MG 1.7  --  2.1  GFRNONAA >60 >60 >60  ANIONGAP 9 8 8     Recent Labs  Lab 06/17/21 0856  PROT 8.4*  ALBUMIN 4.4  AST 87*  ALT 81*  ALKPHOS 97  BILITOT 1.2   Lipids No results for input(s): CHOL, TRIG, HDL, LABVLDL, LDLCALC, CHOLHDL in the last 168 hours.  Hematology Recent Labs  Lab 06/18/21 0628 06/19/21 0836 06/20/21 0330  WBC 15.4* 15.9* 16.5*  RBC 4.86 5.11 5.45  HGB 14.4 15.3 16.2  HCT 42.0 43.8 46.8  MCV 86.4 85.7 85.9  MCH 29.6 29.9 29.7  MCHC 34.3 34.9 34.6  RDW 12.6 12.5 12.7  PLT 187 264 321   Radiology/Studies:  CT Maxillofacial W  Contrast  Result Date: 06/17/2021 CLINICAL DATA:  Periorbital edema extending down to the mandible, fever EXAM: CT MAXILLOFACIAL WITH CONTRAST TECHNIQUE: Multidetector CT imaging of the maxillofacial structures was performed with intravenous contrast. Multiplanar CT image reconstructions were also generated. CONTRAST:  2mL OMNIPAQUE IOHEXOL 300 MG/ML  SOLN COMPARISON:  None. FINDINGS: Osseous: There is no acute fracture. There is no suspicious osseous lesion. Mandibular alignment is normal. Orbits: Globes are intact. The extraocular muscles are normal. The orbital fat is preserved, without evidence of postseptal cellulitis. Sinuses: There is mild mucosal thickening in the paranasal sinuses with a prominent left maxillary sinus mucous retention cyst. Soft tissues: There is skin thickening and associated subcutaneous fat stranding in the face bilaterally, right worse than left, extending to the periorbital soft tissues and forehead. No organized fluid collection is seen. Bilateral palatine tonsilliths are noted, right more than left. The imaged salivary glands are normal. There is a linear metallic density foreign body measuring 6 mm in the soft tissues lateral to the left mandibular body of uncertain origin. Limited intracranial: The imaged portions of the intracranial compartment are unremarkable. IMPRESSION: 1. Soft tissue swelling and skin thickening in the face and periorbital soft tissues, right worse than left, likely reflecting cellulitis. There is no evidence of postseptal extension or abscess formation. 2. Linear metallic density foreign bodies in the soft tissues lateral to the left mandibular body of uncertain origin. Correlate with any surgical history and physical exam. Electronically Signed   By: 06/19/2021 M.D.   On: 06/17/2021 11:03     Assessment and Plan:   Atrial fibrillation with rapid ventricular rate -Prior history of paroxysmal atrial fibrillation s/p ablation by Dr. Lesia Hausen in [facility] 2008 & Dr. [facility] December 2016.  He has failed flecainide prior to second ablation.  He was doing relatively well after his ablation with rare episode of atrial fibrillation lasting for less than 1 hour. -Had brief episode of A. fib yesterday morning for less than 1 minute around 0800 then went into atrial fibrillation with rapid ventricular rate at 1800 after getting IV Solu-Medrol.  His episode of atrial fibrillation likely induced by facial cellulitis versus IV Solu-Medrol use (less suspected as patient been getting Solu-Medrol since admission).  -Heart rate was sustaining greater than 140 minutes despite treatment with IV digoxin x1 and IV Cardizem x3.  Now given diltiazem load and on gtt >> increased dose to 10mg  /hr and advised nurse to titrate further as blood pressure allows. -Pending TSH and echocardiogram -His CHA2DS2-VASc score is 1 for high blood pressure.  Will review anticoagulation with MD.  Likely start Eliquis and outpatient cardioversion after 3 weeks of anticoagulation if remains in atrial fibrillation.  He feels palpitation otherwise asymptomatic.  Will arrange follow-up in atrial fibrillation clinic.  2. HTN -Amlodipine stopped due to use of Cardizem  3.  Facial cellulitis -Per primary team  Dr. Stanford Breed to see.   Risk Assessment/Risk Scores:   CHA2DS2-VASc Score = 1   This indicates a 0.6% annual risk of stroke. The patient's score is based upon: CHF History: 0 HTN History: 1 Diabetes History: 0 Stroke History: 0 Vascular Disease History: 0 Age Score: 0 Gender Score: 0   For questions or updates, please contact [place] Please consult [email or web address] for contact info under   SignedLeanor Kail, PA  06/20/2021 9:47 AM   As above, patient seen and examined.  Briefly he is a 55 year old male with past medical history of paroxysmal atrial fibrillation status post ablation x2, hypertension, Raynaud's admitted with facial  cellulitis for evaluation of atrial fibrillation.  Patient typically does not have dyspnea on exertion, orthopnea, PND, pedal edema, exertional chest pain or syncope.  He has had occasional palpitations that he experiences with his atrial fibrillation for an hour at a time but typically resolve spontaneously.  He was admitted with facial cellulitis and noted to go into atrial fibrillation with rapid ventricular response and cardiology asked to evaluate.  His only symptom at present is palpitations. Creatinine 0.8, hemoglobin 16.2, TSH 2.268, electrocardiogram shows atrial fibrillation with rapid ventricular response and occasional PVC or aberrantly conducted beat.  1 paroxysmal atrial fibrillation-patient has developed recurrent atrial fibrillation.  However this may be related to the hyperadrenergic state associated with facial cellulitis.  Continue Cardizem for rate control.  Can add metoprolol if needed as well.  Hopefully he will convert on his own.  CHA2DS2-VASc is 1 for hypertension.  We will add apixaban 5 mg twice daily.  If he does not convert on his own then we will plan outpatient cardioversion once cellulitis improves.  Hopefully this is related to his acute condition.  If he has more frequent episodes may need repeat ablation.  Schedule echocardiogram to assess LV function.  TSH normal.  2 hypertension-continue Cardizem and follow.  3 facial cellulitis-continue antibiotics per primary service.  Kirk Ruths, MD

## 2021-06-20 NOTE — Plan of Care (Signed)
Discussed with patient plan of care for the evening, pain management and using the bathroom to have a bowel movement with some teach back displayed.  Patient complained of not getting appropriate rest and requested to have bedtime medications early and not be disturbed until lab draw in the morning if at all possible.  Problem: Education: Goal: Knowledge of General Education information will improve Description: Including pain rating scale, medication(s)/side effects and non-pharmacologic comfort measures Outcome: Progressing   Problem: Health Behavior/Discharge Planning: Goal: Ability to manage health-related needs will improve Outcome: Progressing

## 2021-06-20 NOTE — Significant Event (Signed)
Rapid Response Event Note   Reason for Call :  Administer Digoxin for patient in Afib.  Initial Focused Assessment:  Patient alert, oriented, follows all commands, denies pain, but states he feels the fast heart rate. Patient breathing normally without distress. Patient denies dizziness or nausea. Initial BP readings elevated with HR in 150's. Patient already on telemetry. No frank edema observed peripherally. Linton Flemings, NP already involved in care and ordered Digoxin 0.25 mg IV push x 1.  Interventions:  Completed focused assessment. Peripheral IV on left arm flushing well. Administered Digoxin as prescribed. Communicated vitals with Linton Flemings, NP. Manual BP's assessed by bedside RN were found to be 150-160's/80's which were improved from automatic readings on dynamap.   Plan of Care:  X. Blount states that due to intolerance of beta blockers and necessary use of IV steroids, will plan to monitor patient for now. Patient verbalized agreement with safety plan to not get OOB unassisted due to high risk of fainting/falling. Patient will remain on telemetry and current room/unit.   Event Summary:   MD Notified: 2100 Call Time: 2100 Arrival Time: 2120 End Time: 2200  Lamona Curl, RN

## 2021-06-20 NOTE — Progress Notes (Signed)
On-call NP was paged as the patient continues to stay in A-fib with HR 120s-140s. IV Digoxin 0.25 mg was ordered. Rapid response nurse was called by this writer to administer the IV digoxin.

## 2021-06-20 NOTE — Significant Event (Signed)
Rapid Response Event Note   Reason for Call :  HR back to 130's.  Initial Focused Assessment:  Patient reports inability to sleep tonight, still feels palpitations, but denies shortness of breath or chest pain. Apical HR 135, RR 14.  Interventions:  Repeat assessment, EKG, vitals, STAT labs ordered.  Plan of Care:  Await lab results. Orders given to transfer to cardiac telemetry. Will put in room 1238.   Event Summary:   MD Notified: X. Blount, NP (came to bedside) Call Time: 0250 Arrival Time: 0300 End Time: 0344  Lamona Curl, RN

## 2021-06-21 LAB — COMPREHENSIVE METABOLIC PANEL
ALT: 51 U/L — ABNORMAL HIGH (ref 0–44)
AST: 35 U/L (ref 15–41)
Albumin: 2.9 g/dL — ABNORMAL LOW (ref 3.5–5.0)
Alkaline Phosphatase: 78 U/L (ref 38–126)
Anion gap: 10 (ref 5–15)
BUN: 19 mg/dL (ref 6–20)
CO2: 22 mmol/L (ref 22–32)
Calcium: 8.5 mg/dL — ABNORMAL LOW (ref 8.9–10.3)
Chloride: 106 mmol/L (ref 98–111)
Creatinine, Ser: 0.89 mg/dL (ref 0.61–1.24)
GFR, Estimated: >60 mL/min (ref >60)
Glucose, Bld: 113 mg/dL — ABNORMAL HIGH (ref 70–99)
Potassium: 3.4 mmol/L — ABNORMAL LOW (ref 3.5–5.1)
Sodium: 138 mmol/L (ref 135–145)
Total Bilirubin: 0.6 mg/dL (ref 0.3–1.2)
Total Protein: 6.2 g/dL — ABNORMAL LOW (ref 6.5–8.1)

## 2021-06-21 LAB — CBC
HCT: 44.4 % (ref 39.0–52.0)
Hemoglobin: 15.2 g/dL (ref 13.0–17.0)
MCH: 29.8 pg (ref 26.0–34.0)
MCHC: 34.2 g/dL (ref 30.0–36.0)
MCV: 87.1 fL (ref 80.0–100.0)
Platelets: 306 10*3/uL (ref 150–400)
RBC: 5.1 MIL/uL (ref 4.22–5.81)
RDW: 13 % (ref 11.5–15.5)
WBC: 10.8 10*3/uL — ABNORMAL HIGH (ref 4.0–10.5)
nRBC: 0 % (ref 0.0–0.2)

## 2021-06-21 LAB — MAGNESIUM: Magnesium: 1.8 mg/dL (ref 1.7–2.4)

## 2021-06-21 MED ORDER — CEPHALEXIN 500 MG PO CAPS: 500.0000 mg | ORAL_CAPSULE | Freq: Four times a day (QID) | ORAL | 0 refills | Status: AC

## 2021-06-21 MED ORDER — POTASSIUM CHLORIDE 20 MEQ PO PACK
40.0000 meq | PACK | Freq: Once | ORAL | Status: AC
  Administered 2021-06-21: 06:00:00 40 meq via ORAL
  Filled 2021-06-21: qty 2

## 2021-06-21 MED ORDER — DILTIAZEM HCL ER COATED BEADS 120 MG PO CP24
120.0000 mg | ORAL_CAPSULE | Freq: Every day | ORAL | Status: DC
  Administered 2021-06-21: 09:00:00 120 mg via ORAL
  Filled 2021-06-21: qty 1

## 2021-06-21 MED ORDER — APIXABAN 5 MG PO TABS: 5.0000 mg | ORAL_TABLET | Freq: Two times a day (BID) | ORAL | 2 refills | Status: AC

## 2021-06-21 MED ORDER — DILTIAZEM HCL ER COATED BEADS 120 MG PO CP24
120.0000 mg | ORAL_CAPSULE | Freq: Every day | ORAL | 1 refills | Status: AC
Start: 2021-06-22 — End: ?

## 2021-06-21 NOTE — Progress Notes (Signed)
° °  F/u arranged at the Afib Clinic on 07/07/2021 at 3:00 pm.  Theodore Demark, PA-C 06/21/2021 9:42 AM

## 2021-06-21 NOTE — Discharge Instructions (Addendum)
Advised to follow-up with primary care physician in 1 week. Advised to follow-up with cardiology on January 5 for follow-up. Advised to take Cardizem CD1 20 mg daily for heart rate control. Advised to take Eliquis 5 mg twice daily for anticoagulation. Advised to take Keflex 500 mg 4 times daily for 4 days to complete 7-day treatment for erysipelas.   Information on my medicine - ELIQUIS (apixaban)   Why was Eliquis prescribed for you? Eliquis was prescribed for you to reduce the risk of a blood clot forming that can cause a stroke if you have a medical condition called atrial fibrillation (a type of irregular heartbeat).  What do You need to know about Eliquis ? Take your Eliquis TWICE DAILY - one tablet in the morning and one tablet in the evening with or without food. If you have difficulty swallowing the tablet whole please discuss with your pharmacist how to take the medication safely.  Take Eliquis exactly as prescribed by your doctor and DO NOT stop taking Eliquis without talking to the doctor who prescribed the medication.  Stopping may increase your risk of developing a stroke.  Refill your prescription before you run out.  After discharge, you should have regular check-up appointments with your healthcare provider that is prescribing your Eliquis.  In the future your dose may need to be changed if your kidney function or weight changes by a significant amount or as you get older.  What do you do if you miss a dose? If you miss a dose, take it as soon as you remember on the same day and resume taking twice daily.  Do not take more than one dose of ELIQUIS at the same time to make up a missed dose.  Important Safety Information A possible side effect of Eliquis is bleeding. You should call your healthcare provider right away if you experience any of the following: Bleeding from an injury or your nose that does not stop. Unusual colored urine (red or dark brown) or unusual  colored stools (red or black). Unusual bruising for unknown reasons. A serious fall or if you hit your head (even if there is no bleeding).  Some medicines may interact with Eliquis and might increase your risk of bleeding or clotting while on Eliquis. To help avoid this, consult your healthcare provider or pharmacist prior to using any new prescription or non-prescription medications, including herbals, vitamins, non-steroidal anti-inflammatory drugs (NSAIDs) and supplements.  This website has more information on Eliquis (apixaban): http://www.eliquis.com/eliquis/home

## 2021-06-21 NOTE — Progress Notes (Addendum)
Progress Note  Patient Name: Reginald Rich Date of Encounter: 06/21/2021  [facility] Cardiologist: Kirk Ruths, MD new Electrophysiologist: Thompson Grayer, MD 2017  Subjective   He was aware when he converted to sinus rhythm. He has an episode of PAF about once a month, the episodes usually last 15-20 minutes.  Occasionally will last up to an hour.  He exercises a lot, and says he is very aware of his heart rate.  He is very sure that the only episodes of A. fib he has had have been these short, infrequent ones.  He would prefer not to be on oral anticoagulation, but does not want to have a stroke.  Inpatient Medications    Scheduled Meds:  apixaban  5 mg Oral BID   Chlorhexidine Gluconate Cloth  6 each Topical Daily   docusate sodium  100 mg Oral BID   pantoprazole  40 mg Oral Daily   traZODone  50 mg Oral QHS   Continuous Infusions:  cefTRIAXone (ROCEPHIN)  IV Stopped (06/20/21 1157)   diltiazem (CARDIZEM) infusion 2.5 mg/hr (06/20/21 2245)   PRN Meds: acetaminophen **OR** acetaminophen, HYDROcodone-acetaminophen, ondansetron **OR** ondansetron (ZOFRAN) IV, polyethylene glycol, zolpidem   Vital Signs    Vitals:   06/21/21 0530 06/21/21 0559 06/21/21 0600 06/21/21 0700  BP: 117/66  (!) 119/52 131/86  Pulse: 68 65 61 74  Resp: 14 12 11 15   Temp:      TempSrc:      SpO2: 95% 99% 98% 96%  Weight:      Height:        Intake/Output Summary (Last 24 hours) at 06/21/2021 0755 Last data filed at 06/21/2021 0705 Gross per 24 hour  Intake 530 ml  Output 1175 ml  Net -645 ml   Last 3 Weights 06/20/2021 06/20/2021 06/17/2021  Weight (lbs) 206 lb 5.6 oz 167 lb 1.7 oz 199 lb 15.3 oz  Weight (kg) 93.6 kg 75.8 kg 90.7 kg      Telemetry    Rapid Afib >> SR last p.m.  Posttermination pause was less than 2 seconds - Personally Reviewed  ECG    Sinus rhythm, heart rate 73, normal intervals- Personally Reviewed  Physical Exam   GEN: No acute distress.  Has  facial redness, some swelling around his eyes, and flaking of skin on face and head Neck: No JVD Cardiac: RRR, no murmurs, rubs, or gallops.  Respiratory: Clear to auscultation bilaterally. GI: Soft, nontender, non-distended  MS: No edema; No deformity. Neuro:  Nonfocal  Psych: Normal affect   Labs    High Sensitivity Troponin:  No results for input(s): TROPONINIHS in the last 720 hours.   Chemistry Recent Labs  Lab 06/17/21 0856 06/18/21 0628 06/19/21 0836 06/20/21 0330 06/21/21 0304  NA 132* 132* 137 139 138  K 3.8 3.7 3.9 3.8 3.4*  CL 97* 103 105 108 106  CO2 25 20* 24 23 22   GLUCOSE 185* 130* 166* 130* 113*  BUN 15 12 17 17 19   CREATININE 1.15 0.93 0.89 0.80 0.89  CALCIUM 10.3 8.4* 9.2 8.9 8.5*  MG  --  1.7  --  2.1 1.8  PROT 8.4*  --   --   --  6.2*  ALBUMIN 4.4  --   --   --  2.9*  AST 87*  --   --   --  35  ALT 81*  --   --   --  51*  ALKPHOS 97  --   --   --  78  BILITOT 1.2  --   --   --  0.6  GFRNONAA >60 >60 >60 >60 >60  ANIONGAP 10 9 8 8 10     Lipids No results for input(s): CHOL, TRIG, HDL, LABVLDL, LDLCALC, CHOLHDL in the last 168 hours.  Hematology Recent Labs  Lab 06/19/21 0836 06/20/21 0330 06/21/21 0304  WBC 15.9* 16.5* 10.8*  RBC 5.11 5.45 5.10  HGB 15.3 16.2 15.2  HCT 43.8 46.8 44.4  MCV 85.7 85.9 87.1  MCH 29.9 29.7 29.8  MCHC 34.9 34.6 34.2  RDW 12.5 12.7 13.0  PLT 264 321 306   Thyroid  Recent Labs  Lab 06/20/21 0848  TSH 2.268    Lab Results  Component Value Date   HGBA1C 5.7 (H) 06/18/2021    BNPNo results for input(s): BNP, PROBNP in the last 168 hours.  DDimer No results for input(s): DDIMER in the last 168 hours.   Radiology    ECHOCARDIOGRAM COMPLETE  Result Date: 06/20/2021    ECHOCARDIOGRAM REPORT   Patient Name:   Reginald Rich Date of Exam: 06/20/2021 Medical Rec #:  [id number]         Height:       73.0 in Accession #:    [id number]        Weight:       167.1 lb Date of Birth:  1965-08-03        BSA:           1.993 m Patient Age:    55 years          BP:           126/59 mmHg Patient Gender: M                 HR:           104 bpm. Exam Location:  Inpatient Procedure: 2D Echo, Cardiac Doppler and Color Doppler Indications:    I48.91* Unspeicified atrial fibrillation  History:        Patient has prior history of Echocardiogram examinations, most                 recent 09/17/2012. Arrythmias:Atrial Fibrillation; Risk                 Factors:Hypertension.  Sonographer:    Bernadene Person RDCS Referring Phys: [id number] [place]  1. Left ventricular ejection fraction, by estimation, is 60 to 65%. The left ventricle has normal function. The left ventricle has no regional wall motion abnormalities. There is mild left ventricular hypertrophy. Left ventricular diastolic parameters are indeterminate.  2. Right ventricular systolic function is normal. The right ventricular size is normal. There is normal pulmonary artery systolic pressure. The estimated right ventricular systolic pressure is [id number] mmHg.  3. The mitral valve is normal in structure. Trivial mitral valve regurgitation.  4. The aortic valve was not well visualized. Aortic valve regurgitation is not visualized. No aortic stenosis is present.  5. The inferior vena cava is normal in size with greater than 50% respiratory variability, suggesting right atrial pressure of 3 mmHg. FINDINGS  Left Ventricle: Left ventricular ejection fraction, by estimation, is 60 to 65%. The left ventricle has normal function. The left ventricle has no regional wall motion abnormalities. The left ventricular internal cavity size was normal in size. There is  mild left ventricular hypertrophy. Left ventricular diastolic parameters are indeterminate. Right Ventricle: The right ventricular size is normal. No increase in right ventricular wall thickness. Right  ventricular systolic function is normal. There is normal pulmonary artery systolic pressure. The tricuspid regurgitant  velocity is 2.23 m/s, and  with an assumed right atrial pressure of 3 mmHg, the estimated right ventricular systolic pressure is [id number] mmHg. Left Atrium: Left atrial size was normal in size. Right Atrium: Right atrial size was normal in size. Pericardium: Trivial pericardial effusion is present. Mitral Valve: The mitral valve is normal in structure. Trivial mitral valve regurgitation. Tricuspid Valve: The tricuspid valve is normal in structure. Tricuspid valve regurgitation is trivial. Aortic Valve: The aortic valve was not well visualized. Aortic valve regurgitation is not visualized. No aortic stenosis is present. Pulmonic Valve: The pulmonic valve was not well visualized. Pulmonic valve regurgitation is not visualized. Aorta: The aortic root and ascending aorta are structurally normal, with no evidence of dilitation. Venous: The inferior vena cava is normal in size with greater than 50% respiratory variability, suggesting right atrial pressure of 3 mmHg. IAS/Shunts: The interatrial septum was not well visualized.  LEFT VENTRICLE PLAX 2D LVIDd:         4.80 cm LVIDs:         3.10 cm LV PW:         1.20 cm LV IVS:        1.10 cm LVOT diam:     2.10 cm LV SV:         54 LV SV Index:   27 LVOT Area:     3.46 cm  RIGHT VENTRICLE TAPSE (M-mode): 2.0 cm LEFT ATRIUM             Index        RIGHT ATRIUM           Index LA diam:        3.40 cm 1.71 cm/m   RA Area:     13.30 cm LA Vol (A2C):   50.6 ml 25.38 ml/m  RA Volume:   28.10 ml  14.10 ml/m LA Vol (A4C):   42.8 ml 21.47 ml/m LA Biplane Vol: 46.6 ml 23.38 ml/m  AORTIC VALVE LVOT Vmax:   100.57 cm/s LVOT Vmean:  67.775 cm/s LVOT VTI:    0.155 m  AORTA Ao Root diam: 3.60 cm Ao Asc diam:  3.30 cm TRICUSPID VALVE TR Peak grad:   19.9 mmHg TR Vmax:        223.00 cm/s  SHUNTS Systemic VTI:  0.15 m Systemic Diam: 2.10 cm Oswaldo Milian MD Electronically signed by Oswaldo Milian MD Signature Date/Time: 06/20/2021/3:32:27 PM    Final     Cardiac Studies    ECHO: 06/20/2021 1. Left ventricular ejection fraction, by estimation, is 60 to 65%. The  left ventricle has normal function. The left ventricle has no regional  wall motion abnormalities. There is mild left ventricular hypertrophy.  Left ventricular diastolic parameters are indeterminate.   2. Right ventricular systolic function is normal. The right ventricular  size is normal. There is normal pulmonary artery systolic pressure. The estimated right ventricular systolic pressure is [id number] mmHg.   3. The mitral valve is normal in structure. Trivial mitral valve  regurgitation.   4. The aortic valve was not well visualized. Aortic valve regurgitation  is not visualized. No aortic stenosis is present.   5. The inferior vena cava is normal in size with greater than 50%  respiratory variability, suggesting right atrial pressure of 3 mmHg. 6.  Both atria are normal in size  Patient Profile     55 y.o. 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   Dx facial cellulitis and likely erysipelas. No abscess on CT. Started on IV ABX.   He was initially in SR, but after a dose of Solu-medrol, went into rapid  atrial fib, Cards asked to see.   Assessment & Plan    Atrial fib, RVR - likely related to hyperadrenergic state from acute illness and steroids - started on Cardizem IV, converted to SR -When he converted, he was on 2.5 mg/h >> we will convert to Cardizem CD 120 mg daily - Started on Eliquis 5 mg bid, CHA2DS2-VASc = 1 -He would prefer not to be on Eliquis long-term, but is willing to stay on it until he follows up with Dr. Johney Frame >> we will arrange  2.  Hypertension: - He has not been on medications for hypertension prior to admission - However, he has been checking his blood pressure and says it has been running in the 140s over 90s, so he is  willing to take medication - The Cardizem CD1 120 mg may be all he will need for better blood pressure control  3.  Facial cellulitis, erysipelas -Antibiotics per IM  Other issues, per IM For questions or updates, please contact [facility] Please consult [email or web address] for contact info under   Signed, Theodore Demark, PA-C  06/21/2021, 7:55 AM   As above, patient seen and examined.  He denies chest pain dyspnea or palpitations.  He has converted to sinus rhythm.  We will continue apixaban 5 mg twice daily for now.  Continue Cardizem CD1 120 mg daily (amlodipine has been discontinued).  Atrial fibrillation was likely due to the hyperadrenergic state associated with facial cellulitis.  Hopefully this will not become more frequent.  We will have him follow-up in atrial fibrillation clinic.  Cardiology will sign off.  Please call with questions.  Olga Millers, MD

## 2021-06-21 NOTE — Progress Notes (Signed)
IV removed. Site clean, dry and intact. Discharge education provided to patient. Pt verbalized understanding. Pt transported to car by wheelchair.

## 2021-06-21 NOTE — Discharge Summary (Signed)
Physician Discharge Summary  Reginald Rich [id number] DOB: [birth year] DOA: 06/17/2021  PCP: Patient, No Pcp Per (Inactive)  Admit date: 06/17/2021   Discharge date: 06/21/2021  Admitted From: Home.  Disposition:  Home  Recommendations for Outpatient Follow-up:  Follow up with PCP in 1-2 weeks. Please obtain BMP/CBC in one week. Advised to follow-up with cardiology on January 5 for follow-up. Advised to take Cardizem CD 120 mg daily for heart rate control. Advised to take Eliquis 5 mg twice daily for anticoagulation. Advised to take Keflex 500 mg 4 times daily for 4 days to complete 7-day treatment for erysipelas.  Home Health: None Equipment/Devices: None  Discharge Condition: Stable CODE STATUS:Full code Diet recommendation: Heart Healthy   Brief Summary / Hospital Course: This 55 y.o. male with PMH significant for hypertension, paroxysmal atrial fibrillation, Raynaud's phenomena associated with beta-blocker usage presented to the hospital with c/o: fever and facial swelling with puffiness and swelling of the eyes.  Patient was alternating ibuprofen and Tylenol at home for this problem without any improvement.  Patient stated that it started with a pimple on his nasal area, not sure which side. In the ED, patient had blood cultures drawn.  COVID and influenza was negative. Sodium was low at 132.  Creatinine 1.1.  Lactate was 1.3.  Patient had mild leukocytosis with WBC at 13.2 with absolute neutrophil at 12.3.  CT of maxillofacial area showed soft tissue swelling and skin thickening in the face and periorbital soft tissues right worse than the left likely reflecting cellulitis.  There was no evidence of post septal extension or abscess.  Patient received IV Rocephin in the ED.  Patient was admitted for facial cellulitis likely erysipelas,  Patient was started on IV ceftriaxone.  Infectious disease recommended total 7 days of antibiotics.  Hospital course complicated for atrial  fibrillation with RVR requiring Cardizem infusion.  Cardiology was consulted.  Patient converted to normal sinus rhythm with Cardizem gtt.  Patient feels better,  cellulitis has significantly improved.  Cardiology signed off since patient converted to NSR.  Outpatient appointment in [place] [facility] scheduled on July 07, 2021.  Patient is started on Eliquis 5 mg twice daily for anticoagulation. 2D echocardiogram shows improved LVEF.  Patient is being discharged home on Keflex 500 mg 4 times daily for 4 more days to complete 7-day treatment for cellulitis.  Patient was also started on Cardizem CD 120 mg daily for heart rate control.  He was managed for below problems.  Discharge Diagnoses:  Principal Problem:   Facial cellulitis Active Problems:   Essential hypertension   A-fib (HCC)   Cellulitis  Facial cellulitis likely erysipelas.   Patient reported it started as a pimple over the nose, Widespread on presentation with periorbital puffiness, bilateral edema,  erythema with yellowish pustular looking skin area. CT maxillofacial area however did not show any evidence of abscess. hemoglobin A1c of 5.7, HIV was nonreactive..  Patient received IV Solu-Medrol with improvement in his swelling but due to the jitteriness has been discontinued at this time.  Temperature max of 98.37F. Blood cultures no growth to date.  Leukocytosis but was on steroids.  Communicated with infectious disease, Dr. Linus Salmons. We will continue with Rocephin, total course of antibiotic would be 7 to 10 days.  Cellulitis much improved.  Patient is being discharged on Keflex for 4 more days,   Paroxysmal atrial fibrillation with RVR. History of atrial fibrillation status post ablation and failure with antiarrhythmics in the past.  Cardiology has been  consulted.  Current heart rates between 140-160s. Blood pressure is still stable.  History of ablation for atrial fibrillation in the past, not on anticoagulation or any medication at home.   Patient is started on Cardizem gtt.  Overnight patient converted to NSR.  Cardizem gtt. transition to Cardizem CD 120 mg daily. Patient also started on Eliquis 5 mg twice daily for anticoagulation.    Essential hypertension.  Not on medication at home..  Usually 140 systolic at home as per the patient.   Patient was started on amlodipine,  due to new onset A. fib with RVR has been started on Cardizem drip.  Amlodipine discontinued, Cardizem CD1 20 mg daily started.  Mildly elevated LFTs.  On initial LFTs.  Could be secondary to fatty liver disease. Will need outpatient monitoring.  Check CMP in AM.    Discharge Instructions  Discharge Instructions     Call MD for:  difficulty breathing, headache or visual disturbances   Complete by: As directed    Call MD for:  persistant dizziness or light-headedness   Complete by: As directed    Call MD for:  persistant nausea and vomiting   Complete by: As directed    Diet - low sodium heart healthy   Complete by: As directed    Diet Carb Modified   Complete by: As directed    Discharge instructions   Complete by: As directed    Advised to follow-up with primary care physician in 1 week. Advised to follow-up with cardiology on January 5 for follow-up. Advised to take Cardizem CD1 20 mg daily for heart rate control. Advised to take Eliquis 5 mg twice daily for anticoagulation. Advised to take Keflex 500 mg 4 times daily for 4 days to complete 7-day treatment for erysipelas.   Increase activity slowly   Complete by: As directed       Allergies as of 06/21/2021   No Known Allergies      Medication List     STOP taking these medications    traZODone 50 MG tablet Commonly known as: DESYREL       TAKE these medications    acetaminophen 325 MG tablet Commonly known as: TYLENOL Take 650 mg by mouth every 8 (eight) hours as needed for fever (pain).   apixaban 5 MG Tabs tablet Commonly known as: ELIQUIS Take 1 tablet (5 mg total)  by mouth 2 (two) times daily.   cephALEXin 500 MG capsule Commonly known as: KEFLEX Take 1 capsule (500 mg total) by mouth 4 (four) times daily for 4 days.   diltiazem 120 MG 24 hr capsule Commonly known as: CARDIZEM CD Take 1 capsule (120 mg total) by mouth daily. Start taking on: June 22, 2021   ibuprofen 200 MG tablet Commonly known as: ADVIL Take 400 mg by mouth every 8 (eight) hours as needed for fever (pain).        Follow-up Information     Hillis Range, MD Follow up in 1 week(s).   Specialty: Cardiology Contact information: [address] [place] [address] [phone]         Lewayne Bunting, MD .   Specialty: Cardiology Contact information: [address] [place] [address] [phone]                No Known Allergies  Consultations: Cardiology   Procedures/Studies: CT Maxillofacial W Contrast  Result Date: 06/17/2021 CLINICAL DATA:  Periorbital edema extending down to the mandible, fever EXAM: CT MAXILLOFACIAL  WITH CONTRAST TECHNIQUE: Multidetector CT imaging of the maxillofacial structures was performed with intravenous contrast. Multiplanar CT image reconstructions were also generated. CONTRAST:  26mL OMNIPAQUE IOHEXOL 300 MG/ML  SOLN COMPARISON:  None. FINDINGS: Osseous: There is no acute fracture. There is no suspicious osseous lesion. Mandibular alignment is normal. Orbits: Globes are intact. The extraocular muscles are normal. The orbital fat is preserved, without evidence of postseptal cellulitis. Sinuses: There is mild mucosal thickening in the paranasal sinuses with a prominent left maxillary sinus mucous retention cyst. Soft tissues: There is skin thickening and associated subcutaneous fat stranding in the face bilaterally, right worse than left, extending to the periorbital soft tissues and forehead. No organized fluid collection is seen. Bilateral palatine tonsilliths are noted, right more than left. The  imaged salivary glands are normal. There is a linear metallic density foreign body measuring 6 mm in the soft tissues lateral to the left mandibular body of uncertain origin. Limited intracranial: The imaged portions of the intracranial compartment are unremarkable. IMPRESSION: 1. Soft tissue swelling and skin thickening in the face and periorbital soft tissues, right worse than left, likely reflecting cellulitis. There is no evidence of postseptal extension or abscess formation. 2. Linear metallic density foreign bodies in the soft tissues lateral to the left mandibular body of uncertain origin. Correlate with any surgical history and physical exam. Electronically Signed   By: Valetta Mole M.D.   On: 06/17/2021 11:03   ECHOCARDIOGRAM COMPLETE  Result Date: 06/20/2021    ECHOCARDIOGRAM REPORT   Patient Name:   FOUAD FAMBROUGH Date of Exam: 06/20/2021 Medical Rec #:  [id number]         Height:       73.0 in Accession #:    [id number]        Weight:       167.1 lb Date of Birth:  1965/12/11        BSA:          1.993 m Patient Age:    26 years          BP:           126/59 mmHg Patient Gender: M                 HR:           104 bpm. Exam Location:  Inpatient Procedure: 2D Echo, Cardiac Doppler and Color Doppler Indications:    I48.91* Unspeicified atrial fibrillation  History:        Patient has prior history of Echocardiogram examinations, most                 recent 09/17/2012. Arrythmias:Atrial Fibrillation; Risk                 Factors:Hypertension.  Sonographer:    Bernadene Person RDCS Referring Phys: [id number] [place]  1. Left ventricular ejection fraction, by estimation, is 60 to 65%. The left ventricle has normal function. The left ventricle has no regional wall motion abnormalities. There is mild left ventricular hypertrophy. Left ventricular diastolic parameters are indeterminate.  2. Right ventricular systolic function is normal. The right ventricular size is normal. There is normal  pulmonary artery systolic pressure. The estimated right ventricular systolic pressure is [id number] mmHg.  3. The mitral valve is normal in structure. Trivial mitral valve regurgitation.  4. The aortic valve was not well visualized. Aortic valve regurgitation is not visualized. No aortic stenosis is present.  5. The inferior vena cava is  normal in size with greater than 50% respiratory variability, suggesting right atrial pressure of 3 mmHg. FINDINGS  Left Ventricle: Left ventricular ejection fraction, by estimation, is 60 to 65%. The left ventricle has normal function. The left ventricle has no regional wall motion abnormalities. The left ventricular internal cavity size was normal in size. There is  mild left ventricular hypertrophy. Left ventricular diastolic parameters are indeterminate. Right Ventricle: The right ventricular size is normal. No increase in right ventricular wall thickness. Right ventricular systolic function is normal. There is normal pulmonary artery systolic pressure. The tricuspid regurgitant velocity is 2.23 m/s, and  with an assumed right atrial pressure of 3 mmHg, the estimated right ventricular systolic pressure is [id number] mmHg. Left Atrium: Left atrial size was normal in size. Right Atrium: Right atrial size was normal in size. Pericardium: Trivial pericardial effusion is present. Mitral Valve: The mitral valve is normal in structure. Trivial mitral valve regurgitation. Tricuspid Valve: The tricuspid valve is normal in structure. Tricuspid valve regurgitation is trivial. Aortic Valve: The aortic valve was not well visualized. Aortic valve regurgitation is not visualized. No aortic stenosis is present. Pulmonic Valve: The pulmonic valve was not well visualized. Pulmonic valve regurgitation is not visualized. Aorta: The aortic root and ascending aorta are structurally normal, with no evidence of dilitation. Venous: The inferior vena cava is normal in size with greater than 50% respiratory  variability, suggesting right atrial pressure of 3 mmHg. IAS/Shunts: The interatrial septum was not well visualized.  LEFT VENTRICLE PLAX 2D LVIDd:         4.80 cm LVIDs:         3.10 cm LV PW:         1.20 cm LV IVS:        1.10 cm LVOT diam:     2.10 cm LV SV:         54 LV SV Index:   27 LVOT Area:     3.46 cm  RIGHT VENTRICLE TAPSE (M-mode): 2.0 cm LEFT ATRIUM             Index        RIGHT ATRIUM           Index LA diam:        3.40 cm 1.71 cm/m   RA Area:     13.30 cm LA Vol (A2C):   50.6 ml 25.38 ml/m  RA Volume:   28.10 ml  14.10 ml/m LA Vol (A4C):   42.8 ml 21.47 ml/m LA Biplane Vol: 46.6 ml 23.38 ml/m  AORTIC VALVE LVOT Vmax:   100.57 cm/s LVOT Vmean:  67.775 cm/s LVOT VTI:    0.155 m  AORTA Ao Root diam: 3.60 cm Ao Asc diam:  3.30 cm TRICUSPID VALVE TR Peak grad:   19.9 mmHg TR Vmax:        223.00 cm/s  SHUNTS Systemic VTI:  0.15 m Systemic Diam: 2.10 cm Oswaldo Milian MD Electronically signed by Oswaldo Milian MD Signature Date/Time: 06/20/2021/3:32:27 PM    Final       Subjective: Patient was seen and examined at bedside.  Overnight events noted.  Patient reports feeling much improved.   Facial cellulitis is significantly improved.  Patient converted to normal sinus rhythm overnight.  Cardizem gtt. Discontinued,  Heart rate remained controlled.  Discharge Exam: Vitals:   06/21/21 1030 06/21/21 1100  BP: (!) 152/86 (!) 151/104  Pulse: 85 83  Resp: 18 11  Temp:    SpO2: 96% 97%  Vitals:   06/21/21 0930 06/21/21 1000 06/21/21 1030 06/21/21 1100  BP: (!) 151/87 (!) 144/91 (!) 152/86 (!) 151/104  Pulse: 75 83 85 83  Resp: 10 12 18 11   Temp:      TempSrc:      SpO2: 97% 97% 96% 97%  Weight:      Height:        General: Pt is alert, awake, not in acute distress Cardiovascular: RRR, S1/S2 +, no rubs, no gallops Respiratory: CTA bilaterally, no wheezing, no rhonchi Abdominal: Soft, NT, ND, bowel sounds + Extremities: no edema, no cyanosis    The results  of significant diagnostics from this hospitalization (including imaging, microbiology, ancillary and laboratory) are listed below for reference.     Microbiology: Recent Results (from the past 240 hour(s))  Blood culture (routine x 2)     Status: None (Preliminary result)   Collection Time: 06/17/21  8:55 AM   Specimen: Left Antecubital; Blood  Result Value Ref Range Status   Specimen Description   Final    LEFT ANTECUBITAL Performed at [facility], [address], [place], [place] [address]    Special Requests   Final    BOTTLES DRAWN AEROBIC AND ANAEROBIC Blood Culture adequate volume Performed at [facility], [address] [place], [place] [address]    Culture   Final    NO GROWTH 4 DAYS Performed at [facility], [place] [address], [place], [place] [address]    Report Status PENDING  Incomplete  Blood culture (routine x 2)     Status: None (Preliminary result)   Collection Time: 06/17/21  8:59 AM   Specimen: BLOOD LEFT HAND  Result Value Ref Range Status   Specimen Description   Final    BLOOD LEFT HAND Performed at [facility], [address] [place], [place] [address]    Special Requests   Final    BOTTLES DRAWN AEROBIC ONLY Blood Culture adequate volume Performed at [facility], [address], [place], [place] [address]    Culture   Final    NO GROWTH 4 DAYS Performed at [facility], [place] [address], [place], [place] [address]    Report Status PENDING  Incomplete  Resp Panel by RT-PCR (Flu A&B, Covid) Nasopharyngeal Swab     Status: None   Collection Time: 06/17/21  9:04 AM   Specimen: Nasopharyngeal Swab; Nasopharyngeal(NP) swabs in vial transport medium  Result Value Ref Range Status   SARS Coronavirus 2 by RT PCR NEGATIVE NEGATIVE Final    Comment: (NOTE) SARS-CoV-2 target nucleic acids are NOT DETECTED.  The SARS-CoV-2 RNA is generally detectable in upper  respiratory specimens during the acute phase of infection. The lowest concentration of SARS-CoV-2 viral copies this assay can detect is 138 copies/mL. A negative result does not preclude SARS-Cov-2 infection and should not be used as the sole basis for treatment or other patient management decisions. A negative result may occur with  improper specimen collection/handling, submission of specimen other than nasopharyngeal swab, presence of viral mutation(s) within the areas targeted by this assay, and inadequate number of viral copies(<138 copies/mL). A negative result must be combined with clinical observations, patient history, and epidemiological information. The expected result is Negative.  Fact Sheet for Patients:  EntrepreneurPulse.com.au  Fact Sheet for Healthcare Providers:  IncredibleEmployment.be  This test is no t yet approved or cleared by the [place] FDA and  has been authorized for detection and/or diagnosis  of SARS-CoV-2 by FDA under an Emergency Use Authorization (EUA). This EUA will remain  in effect (meaning this test can be used) for the duration of the COVID-19 declaration under Section 564(b)(1) of the Act, 21 U.S.C.section 360bbb-3(b)(1), unless the authorization is terminated  or revoked sooner.       Influenza A by PCR NEGATIVE NEGATIVE Final   Influenza B by PCR NEGATIVE NEGATIVE Final    Comment: (NOTE) The Xpert Xpress SARS-CoV-2/FLU/RSV plus assay is intended as an aid in the diagnosis of influenza from Nasopharyngeal swab specimens and should not be used as a sole basis for treatment. Nasal washings and aspirates are unacceptable for Xpert Xpress SARS-CoV-2/FLU/RSV testing.  Fact Sheet for Patients: EntrepreneurPulse.com.au  Fact Sheet for Healthcare Providers: IncredibleEmployment.be  This test is not yet approved or cleared by the [place] FDA and has been  authorized for detection and/or diagnosis of SARS-CoV-2 by FDA under an Emergency Use Authorization (EUA). This EUA will remain in effect (meaning this test can be used) for the duration of the COVID-19 declaration under Section 564(b)(1) of the Act, 21 U.S.C. section 360bbb-3(b)(1), unless the authorization is terminated or revoked.  Performed at [facility], [address] [place], [place] [address]   MRSA Next Gen by PCR, Nasal     Status: None   Collection Time: 06/20/21  4:07 AM   Specimen: Nasal Mucosa; Nasal Swab  Result Value Ref Range Status   MRSA by PCR Next Gen NOT DETECTED NOT DETECTED Final    Comment: (NOTE) The GeneXpert MRSA Assay (FDA approved for NASAL specimens only), is one component of a comprehensive MRSA colonization surveillance program. It is not intended to diagnose MRSA infection nor to guide or monitor treatment for MRSA infections. Test performance is not FDA approved in patients less than 55 years old. Performed at [facility], [place] [address], [place], [place] [address]      Labs: BNP (last 3 results) No results for input(s): BNP in the last 8760 hours. Basic Metabolic Panel: Recent Labs  Lab 06/17/21 0856 06/18/21 0628 06/19/21 0836 06/20/21 0330 06/21/21 0304  NA 132* 132* 137 139 138  K 3.8 3.7 3.9 3.8 3.4*  CL 97* 103 105 108 106  CO2 25 20* 24 23 22   GLUCOSE 185* 130* 166* 130* 113*  BUN 15 12 17 17 19   CREATININE 1.15 0.93 0.89 0.80 0.89  CALCIUM 10.3 8.4* 9.2 8.9 8.5*  MG  --  1.7  --  2.1 1.8   Liver Function Tests: Recent Labs  Lab 06/17/21 0856 06/21/21 0304  AST 87* 35  ALT 81* 51*  ALKPHOS 97 78  BILITOT 1.2 0.6  PROT 8.4* 6.2*  ALBUMIN 4.4 2.9*   No results for input(s): LIPASE, AMYLASE in the last 168 hours. No results for input(s): AMMONIA in the last 168 hours. CBC: Recent Labs  Lab 06/17/21 0856 06/18/21 0628 06/19/21 0836 06/20/21 0330 06/21/21 0304  WBC  13.2* 15.4* 15.9* 16.5* 10.8*  NEUTROABS 12.3*  --   --   --   --   HGB 17.0 14.4 15.3 16.2 15.2  HCT 49.5 42.0 43.8 46.8 44.4  MCV 85.6 86.4 85.7 85.9 87.1  PLT 194 187 264 321 306   Cardiac Enzymes: No results for input(s): CKTOTAL, CKMB, CKMBINDEX, TROPONINI in the last 168 hours. BNP: Invalid input(s): POCBNP CBG: No results for input(s): GLUCAP in the last 168 hours. D-Dimer No results for input(s): DDIMER in the last 72 hours.  Hgb A1c No results for input(s): HGBA1C in the last 72 hours. Lipid Profile No results for input(s): CHOL, HDL, LDLCALC, TRIG, CHOLHDL, LDLDIRECT in the last 72 hours. Thyroid function studies Recent Labs    06/20/21 0848  TSH 2.268   Anemia work up No results for input(s): VITAMINB12, FOLATE, FERRITIN, TIBC, IRON, RETICCTPCT in the last 72 hours. Urinalysis    Component Value Date/Time   COLORURINE YELLOW 09/17/2011 2139   APPEARANCEUR CLOUDY (A) 09/17/2011 2139   LABSPEC 1.017 09/17/2011 2139   PHURINE 5.0 09/17/2011 2139   GLUCOSEU NEGATIVE 09/17/2011 2139   HGBUR LARGE (A) 09/17/2011 2139   BILIRUBINUR NEGATIVE 09/17/2011 2139   KETONESUR NEGATIVE 09/17/2011 2139   PROTEINUR NEGATIVE 09/17/2011 2139   UROBILINOGEN 0.2 09/17/2011 2139   NITRITE NEGATIVE 09/17/2011 2139   LEUKOCYTESUR NEGATIVE 09/17/2011 2139   Sepsis Labs Invalid input(s): PROCALCITONIN,  WBC,  LACTICIDVEN Microbiology Recent Results (from the past 240 hour(s))  Blood culture (routine x 2)     Status: None (Preliminary result)   Collection Time: 06/17/21  8:55 AM   Specimen: Left Antecubital; Blood  Result Value Ref Range Status   Specimen Description   Final    LEFT ANTECUBITAL Performed at [facility], [address] [place], [place] [address]    Special Requests   Final    BOTTLES DRAWN AEROBIC AND ANAEROBIC Blood Culture adequate volume Performed at [facility], [address], [place], [place] [address]     Culture   Final    NO GROWTH 4 DAYS Performed at [facility], [place] [address], [place], [place] [address]    Report Status PENDING  Incomplete  Blood culture (routine x 2)     Status: None (Preliminary result)   Collection Time: 06/17/21  8:59 AM   Specimen: BLOOD LEFT HAND  Result Value Ref Range Status   Specimen Description   Final    BLOOD LEFT HAND Performed at [facility], [address], [place], [place] [address]    Special Requests   Final    BOTTLES DRAWN AEROBIC ONLY Blood Culture adequate volume Performed at [facility], [address] [place], [place] [address]    Culture   Final    NO GROWTH 4 DAYS Performed at [facility], [place] [address] [place], [place] [address]    Report Status PENDING  Incomplete  Resp Panel by RT-PCR (Flu A&B, Covid) Nasopharyngeal Swab     Status: None   Collection Time: 06/17/21  9:04 AM   Specimen: Nasopharyngeal Swab; Nasopharyngeal(NP) swabs in vial transport medium  Result Value Ref Range Status   SARS Coronavirus 2 by RT PCR NEGATIVE NEGATIVE Final    Comment: (NOTE) SARS-CoV-2 target nucleic acids are NOT DETECTED.  The SARS-CoV-2 RNA is generally detectable in upper respiratory specimens during the acute phase of infection. The lowest concentration of SARS-CoV-2 viral copies this assay can detect is 138 copies/mL. A negative result does not preclude SARS-Cov-2 infection and should not be used as the sole basis for treatment or other patient management decisions. A negative result may occur with  improper specimen collection/handling, submission of specimen other than nasopharyngeal swab, presence of viral mutation(s) within the areas targeted by this assay, and inadequate number of viral copies(<138 copies/mL). A negative result must be combined with clinical observations, patient history, and epidemiological information. The expected result is Negative.  Fact Sheet for  Patients:  EntrepreneurPulse.com.au  Fact Sheet for Healthcare Providers:  IncredibleEmployment.be  This test is no t yet approved or cleared by the [place] and  has been authorized for detection and/or diagnosis of SARS-CoV-2 by FDA under an Emergency Use Authorization (EUA). This EUA will remain  in effect (meaning this test can be used) for the duration of the COVID-19 declaration under Section 564(b)(1) of the Act, 21 U.S.C.section 360bbb-3(b)(1), unless the authorization is terminated  or revoked sooner.       Influenza A by PCR NEGATIVE NEGATIVE Final   Influenza B by PCR NEGATIVE NEGATIVE Final    Comment: (NOTE) The Xpert Xpress SARS-CoV-2/FLU/RSV plus assay is intended as an aid in the diagnosis of influenza from Nasopharyngeal swab specimens and should not be used as a sole basis for treatment. Nasal washings and aspirates are unacceptable for Xpert Xpress SARS-CoV-2/FLU/RSV testing.  Fact Sheet for Patients: EntrepreneurPulse.com.au  Fact Sheet for Healthcare Providers: IncredibleEmployment.be  This test is not yet approved or cleared by the [place] FDA and has been authorized for detection and/or diagnosis of SARS-CoV-2 by FDA under an Emergency Use Authorization (EUA). This EUA will remain in effect (meaning this test can be used) for the duration of the COVID-19 declaration under Section 564(b)(1) of the Act, 21 U.S.C. section 360bbb-3(b)(1), unless the authorization is terminated or revoked.  Performed at [facility], [address] [place], [place] [address]   MRSA Next Gen by PCR, Nasal     Status: None   Collection Time: 06/20/21  4:07 AM   Specimen: Nasal Mucosa; Nasal Swab  Result Value Ref Range Status   MRSA by PCR Next Gen NOT DETECTED NOT DETECTED Final    Comment: (NOTE) The GeneXpert MRSA Assay (FDA approved for NASAL specimens  only), is one component of a comprehensive MRSA colonization surveillance program. It is not intended to diagnose MRSA infection nor to guide or monitor treatment for MRSA infections. Test performance is not FDA approved in patients less than 42 years old. Performed at [facility], [place] [address] [place], [place] [address]      Time coordinating discharge: Over 30 minutes  SIGNED:   Shawna Clamp, MD  [facility] 06/21/2021, 11:55 AM Pager   If 7PM-7AM, please contact night-coverage

## 2021-06-22 LAB — CULTURE, BLOOD (ROUTINE X 2)
Culture: NO GROWTH
Culture: NO GROWTH
Special Requests: ADEQUATE
Special Requests: ADEQUATE

## 2021-07-07 ENCOUNTER — Ambulatory Visit (HOSPITAL_COMMUNITY): Payer: 59 | Admitting: Physician Assistant
